# Patient Record
Sex: Female | Born: 1945 | Hispanic: Refuse to answer | Smoking: Never smoker
Health system: Southern US, Community
[De-identification: ages and names within clinical notes are randomized; demographics above are authoritative.]

## PROBLEM LIST (undated history)

## (undated) DIAGNOSIS — D649 Anemia, unspecified: Secondary | ICD-10-CM

## (undated) DIAGNOSIS — C189 Malignant neoplasm of colon, unspecified: Secondary | ICD-10-CM

## (undated) HISTORY — PX: APPENDECTOMY: SHX54

## (undated) HISTORY — PX: KNEE ARTHROSCOPY: SHX127

## (undated) HISTORY — PX: COLON RESECTION: SHX5231

## (undated) HISTORY — PX: COLOSTOMY: SHX63

---

## 2011-08-18 DIAGNOSIS — D649 Anemia, unspecified: Secondary | ICD-10-CM

## 2011-08-18 DIAGNOSIS — C189 Malignant neoplasm of colon, unspecified: Secondary | ICD-10-CM

## 2011-08-18 HISTORY — DX: Anemia, unspecified: D64.9

## 2011-08-18 HISTORY — DX: Malignant neoplasm of colon, unspecified: C18.9

## 2011-11-12 ENCOUNTER — Telehealth: Payer: Self-pay | Admitting: Oncology

## 2011-11-12 NOTE — Telephone Encounter (Signed)
S/w the pt and she is aware of her appt with dr Truett Perna on 11/22/2011

## 2011-11-15 ENCOUNTER — Telehealth: Payer: Self-pay | Admitting: Oncology

## 2011-11-15 NOTE — Telephone Encounter (Signed)
Dx-Appendical. NP packet mailed out.

## 2011-11-16 ENCOUNTER — Telehealth: Payer: Self-pay | Admitting: Oncology

## 2011-11-16 NOTE — Telephone Encounter (Signed)
Pt. Will hand carry her medical records to her appt. With Dr. Truett Perna on 11/22/11.  No chart.

## 2011-11-22 ENCOUNTER — Ambulatory Visit (HOSPITAL_BASED_OUTPATIENT_CLINIC_OR_DEPARTMENT_OTHER): Payer: Medicare Other | Admitting: Oncology

## 2011-11-22 ENCOUNTER — Ambulatory Visit (HOSPITAL_BASED_OUTPATIENT_CLINIC_OR_DEPARTMENT_OTHER): Payer: Medicare Other

## 2011-11-22 VITALS — BP 143/77 | HR 78 | Temp 97.4°F | Resp 20 | Ht 63.0 in | Wt 198.9 lb

## 2011-11-22 DIAGNOSIS — C189 Malignant neoplasm of colon, unspecified: Secondary | ICD-10-CM

## 2011-11-22 DIAGNOSIS — C779 Secondary and unspecified malignant neoplasm of lymph node, unspecified: Secondary | ICD-10-CM

## 2011-11-22 NOTE — Progress Notes (Signed)
Still packing abdominal wound daily.

## 2011-11-22 NOTE — Progress Notes (Signed)
[facility] New Patient Consult   Referring MD: Kyra Searles   Kristen Alexander 66 y.o.  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 A CT of the abdomen and pelvis on 09/17/2011 found the liver, gallbladder, spleen, adrenal glands, and pancreas to be unremarkable. A small amount of free fluid was noted around the liver. A moderate amount of free fluid was noted in the pelvis. The uterus and adnexal structures appeared unremarkable. The appendix was noted to be thickened with. Appendiceal stranding. Numerous dilated small bowel loops were noted with numerous air-fluid levels.  She was taken to the operating room on 09/17/2011 with a presumed diagnosis of appendicitis. A planned laparoscopic appendectomy was converted to an open exploratory laparotomy and extended right colectomy/small bowel resection. Metastatic implants were noted along the lateral peritoneal wall. The terminal ileum was noted to be involved with "white scarring neoplasm "and high-grade stenosis. A large indurated mass was noted at the ileocecal region. A right colectomy was performed with a side-to-side anastomosis between the proximal right transverse colon and ileum. There was no evidence of seeding or peritoneal lesions of the ovary structures were surface of the liver.  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 Tumor extended through all layers of the muscularis propria into the. Peri-intestinal soft tissue and involved the serosal surface. Extensive lymphatic space invasion was noted with extensive involvement of the pericolonic soft tissue noted. Metastatic adenocarcinoma was noted within the ileum and 13 of 16 peri-intestinal lymph  nodes. The appendix was not identified.  She has recovered from surgery. She has been evaluated by Dr. Christoper Fabian from medical oncology. He has recommended an observation approach.  Past medical history: G0 P0  Past surgical history: Left knee arthroscopy in 2  Family history: Her brother died of "cancer "-he was a smoker. She is not aware of the specific tumor type. She had 2 brothers and 2 sisters. No other family history of cancer including colon cancer.  Medications: None  Allergies: No Known Allergies  Social History: She lives in [place]. She works in a Materials engineer. She does not use tobacco. She drinks alcohol on rare occasion. No risk factor for HIV or hepatitis.  ROS:   Positives include: Cramping abdominal pain for 1 week prior to surgery  A complete ROS was otherwise negative.  Physical Exam:  Blood pressure 143/77, pulse 78, temperature 97.4 F (36.3 C), temperature source Oral, resp. rate 20, height 5\' 3"  (1.6 m), weight 198 lb 14.4 oz (90.22 kg).  HEENT: Oropharynx without visible mass, neck without mass Lungs: Clear bilaterally Cardiac: Regular rate and rhythm Abdomen: No hepatomegaly, no mass, no apparent ascites, no splenomegaly  Vascular: No leg edema Lymph nodes: No cervical, supraclavicular, axillary or inguinal nodes Neurologic: Alert and oriented, the motor exam appears intact in the upper and lower extremities Skin: No rash   LAB:    CEA- 0.6 on 10/11/2011 CEA-1.20 on 11/15/2011  Hemoglobin 10.6, MCV 74.6, ferritin 11 on 10/11/2011    Radiology: As per history of present illness, chest x-ray-portable on 09/21/2011-normal PET scan on 10/15/2011-negative    Assessment/Plan:   1. 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  2. Microcytic anemia -- likely iron deficiency   Disposition:   She has been  diagnosed with metastatic adenocarcinoma. The tumor appears to have started at the cecum/appendix with metastatic disease involving peritoneal implants and the small bowel. She underwent an extended right hemicolectomy/small bowel resection.  [name] appears asymptomatic from the metastatic colorectal cancer. There is currently no "measurable "disease on physical exam and a staging PET scan. The CEA is normal.  I discussed treatment options at length with the patient and her sister. She understands no therapy will be curative. In the absence of measurable disease I recommend an observation approach. My recommendation is to continue following the CEA and obtain a restaging CT evaluation at a 3-67month interval.  She appears to be a candidate for systemic chemotherapy if she develops significant disease progression on restaging CTs, or symptoms related to the metastatic tumor burden.  We discussed a referral to Dr. Lenis Noon in surgical oncology at [facility] to consider debulking surgery/intraperitoneal chemotherapy. I explained there is no proven survival benefit to this approach, but this could be considered with the lack of visceral tumor involvement. She declined this referral.  She is comfortable with an observation approach. [name] plans to continue clinical followup with Dr. Christoper Fabian.  She lives at [facility] for the majority of the time. We did not schedule a followup appointment at the [facility]. We will be glad to see her in the future as needed.  Caryl Manas 11/22/2011, 8:22 PM

## 2011-11-23 ENCOUNTER — Ambulatory Visit: Payer: Self-pay | Admitting: Oncology

## 2011-11-30 ENCOUNTER — Telehealth: Payer: Self-pay | Admitting: *Deleted

## 2011-11-30 NOTE — Telephone Encounter (Signed)
Received phone call from patient stating she has thought about it and would like a referral to Dr. Lenis Noon at [facility].  This request has been given to Dr. Truett Perna.

## 2012-01-05 ENCOUNTER — Telehealth: Payer: Self-pay | Admitting: *Deleted

## 2012-01-05 NOTE — Telephone Encounter (Signed)
Spoke with patient by phone to follow-up after her visit with Dr. Lenis Noon in [facility].  Patient has decided to have chemotherapy in [place] (where she resides most of the time).  She stated she will be having surgery under the care of Dr. Lenis Noon after treatment.  This RN wished her well and reminded her to please call if she should ever need care at [facility].  She was very Adult nurse.

## 2012-01-18 HISTORY — PX: PORTACATH PLACEMENT: SHX2246

## 2012-06-05 ENCOUNTER — Telehealth: Payer: Self-pay | Admitting: *Deleted

## 2012-06-05 NOTE — Telephone Encounter (Signed)
Received phone call from patient stating she was referred back to Dr. Truett Perna from Dr. Lenis Noon.  She was inquiring about an appointment.  This RN will check on referral information and will get back in touch with patient re: an appointment.

## 2012-06-05 NOTE — Telephone Encounter (Signed)
Spoke with patient by phone and confirmed appointment with Dr. Truett Perna for 06/16/12.

## 2012-06-06 ENCOUNTER — Telehealth: Payer: Self-pay | Admitting: Oncology

## 2012-06-06 ENCOUNTER — Ambulatory Visit (HOSPITAL_BASED_OUTPATIENT_CLINIC_OR_DEPARTMENT_OTHER): Payer: Medicare Other | Admitting: Oncology

## 2012-06-06 ENCOUNTER — Ambulatory Visit (HOSPITAL_BASED_OUTPATIENT_CLINIC_OR_DEPARTMENT_OTHER): Payer: Medicare Other

## 2012-06-06 ENCOUNTER — Encounter: Payer: Self-pay | Admitting: Oncology

## 2012-06-06 ENCOUNTER — Other Ambulatory Visit: Payer: Self-pay | Admitting: *Deleted

## 2012-06-06 VITALS — BP 127/76 | HR 96 | Temp 98.2°F | Resp 20 | Wt 159.0 lb

## 2012-06-06 DIAGNOSIS — C779 Secondary and unspecified malignant neoplasm of lymph node, unspecified: Secondary | ICD-10-CM

## 2012-06-06 DIAGNOSIS — C189 Malignant neoplasm of colon, unspecified: Secondary | ICD-10-CM

## 2012-06-06 MED ORDER — OXYCODONE HCL 10 MG PO TABS: 10.0000 mg | ORAL_TABLET | ORAL | Status: DC | PRN

## 2012-06-06 NOTE — Telephone Encounter (Signed)
Pt r/s to 02/18 @ 3:30 per MD.

## 2012-06-06 NOTE — Telephone Encounter (Signed)
Talked to patient , gave her appt for lab, chemo class, MD and Ml visit advised patient to get appt calendar for this month of MArch 2014

## 2012-06-06 NOTE — Progress Notes (Signed)
[facility]    OFFICE PROGRESS NOTE   INTERVAL HISTORY:   I saw her in August of 2013 after she had been diagnosed with metastatic adenocarcinoma of the cecum in May of 2013.  She saw Dr. Lenis Noon to consider debulking surgery and intraperitoneal chemotherapy. He recommended FOLFOX chemotherapy. She returned to Dr. Christoper Fabian and reports receiving 3 cycles of FOLFOX. She states that she developed progressive nausea while on FOLFOX with associated weight loss. The chemotherapy was switched to FOLFIRI (she reports completing 2 cycles) and she was admitted to [facility] in December of 2013. She underwent a laparotomy with a jejuno-colic anastomosis. The distal jejunum and most of the ileum was obstructed by tumor.  The nausea resolved following the bypass surgery in December of 2013. She now has a good appetite. Intermittent abdominal "cramping "persists.  She saw Dr. Lenis Noon on 05/31/2012. He recommends additional FOLFIRI chemotherapy +/-Avastin prior to consideration of debulking surgery.  A restaging CT at [facility] on 05/31/2012 revealed an area of focally dilated small bowel in the pelvis with areas of small bowel wall thickening and hypoenhancement. A nodule was noted in the small bowel mesentery measuring 2.2 x 1.5 cm. The lower chest was unremarkable.  Ms. Cliett is now living in [place]. She is referred to continue chemotherapy here.  Review of systems: Positives-intermittent cramping abdominal pain, 40 pound weight loss prior to abdominal bypass surgery in December of 2013 A complete review of systems was otherwise negative  Objective:  Vital signs in last 24 hours:  Blood pressure 127/76, pulse 96, temperature 98.2 F (36.8 C), temperature source Oral, resp. rate 20, weight 159 lb (72.122 kg).    HEENT: No thrush or ulcers Lymphatics: No cervical, supraclavicular, axillary, or inguinal nodes Resp: Lungs clear bilaterally Cardio: Regular rate  and rhythm GI: No hepatomegaly, no apparent ascites, no mass, nontender, healed midline incision Vascular: No leg edema Neuro: Alert and oriented  Skin: No rash   Left arm passport without erythema   Medications: I have reviewed the patient's current medications.  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 -Status post FOLFOX (3 cycles) with progressive nausea and weight loss, chemotherapy switch to FOLFIRI (2 cycles) -Admission with a bowel obstruction December 2013, status post jejuno-colic anastomosis with resolution of obstructive symptoms, progressive carcinomatosis noted at the time of surgery  2. history of a Microcytic anemia -- likely iron deficiency   3. Weight loss secondary to progressive metastatic colon cancer with a bowel obstruction  4. Left arm passport  Disposition:  Ms. Deller has metastatic colon cancer with abdominal carcinomatosis. She developed progressive obstructive symptoms/weight loss while being treated with FOLFOX chemotherapy in the fall of 2013. The chemotherapy was switched to FOLFIRI as she was admitted with a bowel obstruction after 2 cycles (we have requested chemotherapy records and the most recent operative note).  She underwent a palliative bypass procedure and now feels well. Dr. Lenis Noon has reevaluated for HIPEC. He recommends 4-6 cycles of FOLFIRI prior to a restaging evaluation at [facility].  It is not clear that she has progressed on FOLFIRI, but the obstructive symptoms were not relieved with 2 cycles of FOLFIRI. I recommend adding Avastin. We reviewed the potential toxicities associated with FOLFIRI including the chance for nausea/vomiting, mucositis, abdominal pain, and diarrhea. We discussed the allergic reaction, hypertension,  thromboembolic  disease, bleeding, and bowel perforation associated with Avastin. She agrees to proceed with FOLFIRI/Avastin. Ms. Trouten will attend a chemotherapy teaching class.  A first cycle of FOLFIRI/Avastin is scheduled for 06/14/2012. She will return for an office visit and the next cycle of chemotherapy on 06/29/2012. We will obtain a baseline CEA when she returns for the chemotherapy teaching class.  Approximately 45 minutes were spent with the patient today. The majority of the time was spent in counseling/coordination of care.   Thornton Papas, MD  06/06/2012  5:17 PM

## 2012-06-06 NOTE — Progress Notes (Signed)
Has PASSPORT  In left arm for venous access.

## 2012-06-08 ENCOUNTER — Other Ambulatory Visit: Payer: Medicare Other

## 2012-06-08 ENCOUNTER — Other Ambulatory Visit: Payer: Medicare Other | Admitting: Lab

## 2012-06-08 ENCOUNTER — Other Ambulatory Visit: Payer: Self-pay | Admitting: *Deleted

## 2012-06-08 ENCOUNTER — Telehealth: Payer: Self-pay | Admitting: *Deleted

## 2012-06-08 ENCOUNTER — Telehealth: Payer: Self-pay | Admitting: Oncology

## 2012-06-08 LAB — CBC WITH DIFFERENTIAL/PLATELET
Basophils Absolute: 0 10*3/uL (ref 0.0–0.1)
Eosinophils Absolute: 0.2 10*3/uL (ref 0.0–0.5)
HCT: 33.2 % — ABNORMAL LOW (ref 34.8–46.6)
LYMPH%: 29.7 % (ref 14.0–49.7)
MCV: 86.8 fL (ref 79.5–101.0)
MONO#: 0.5 10*3/uL (ref 0.1–0.9)
MONO%: 7.8 % (ref 0.0–14.0)
NEUT#: 3.7 10*3/uL (ref 1.5–6.5)
NEUT%: 59.2 % (ref 38.4–76.8)
Platelets: 363 10*3/uL (ref 145–400)
WBC: 6.3 10*3/uL (ref 3.9–10.3)

## 2012-06-08 LAB — COMPREHENSIVE METABOLIC PANEL (CC13)
ALT: 16 U/L (ref 0–55)
BUN: 14.9 mg/dL (ref 7.0–26.0)
CO2: 25 mEq/L (ref 22–29)
Calcium: 9.1 mg/dL (ref 8.4–10.4)
Chloride: 105 mEq/L (ref 98–107)
Creatinine: 0.7 mg/dL (ref 0.6–1.1)
Glucose: 89 mg/dl (ref 70–99)

## 2012-06-08 NOTE — Telephone Encounter (Signed)
talked to patient and gave her appt for lab ,MD and chemo for February and March 2014

## 2012-06-08 NOTE — Telephone Encounter (Signed)
Review of all records from [place] and [facility]-no imaging report to document cath tip placement of her PASPORT. Ordered CXR per Dr. Truett Perna to be done prior to her chemo. Attempted to reach patient at both #'s without success.

## 2012-06-08 NOTE — Telephone Encounter (Signed)
Per staff message and POF I have scheduled appts.  JMW  

## 2012-06-09 ENCOUNTER — Telehealth: Payer: Self-pay | Admitting: *Deleted

## 2012-06-09 NOTE — Telephone Encounter (Signed)
Called pt, instructed her to go to [facility] radiology 2/25 for chest XRay. Need to verify tip placement prior to chemo. She voiced understanding.

## 2012-06-13 ENCOUNTER — Ambulatory Visit (HOSPITAL_COMMUNITY)
Admission: RE | Admit: 2012-06-13 | Discharge: 2012-06-13 | Disposition: A | Discharge status: Home / Self Care | Payer: Medicare Other | Source: Ambulatory Visit | Attending: Oncology | Admitting: Oncology

## 2012-06-13 DIAGNOSIS — Z452 Encounter for adjustment and management of vascular access device: Secondary | ICD-10-CM | POA: Insufficient documentation

## 2012-06-13 DIAGNOSIS — C189 Malignant neoplasm of colon, unspecified: Secondary | ICD-10-CM | POA: Insufficient documentation

## 2012-06-14 ENCOUNTER — Ambulatory Visit (HOSPITAL_BASED_OUTPATIENT_CLINIC_OR_DEPARTMENT_OTHER): Payer: Medicare Other

## 2012-06-14 ENCOUNTER — Other Ambulatory Visit: Payer: Self-pay | Admitting: Oncology

## 2012-06-14 VITALS — BP 122/77 | HR 63 | Temp 98.0°F | Resp 20

## 2012-06-14 DIAGNOSIS — C189 Malignant neoplasm of colon, unspecified: Secondary | ICD-10-CM

## 2012-06-14 MED ORDER — LEUCOVORIN CALCIUM INJECTION 350 MG
400.0000 mg/m2 | Freq: Once | INTRAVENOUS | Status: AC
  Administered 2012-06-14: 716 mg via INTRAVENOUS
  Filled 2012-06-14: qty 35.8

## 2012-06-14 MED ORDER — INFLUENZA VIRUS VACC SPLIT PF IM SUSP
0.5000 mL | Freq: Once | INTRAMUSCULAR | Status: AC
  Administered 2012-06-14: 0.5 mL via INTRAMUSCULAR
  Filled 2012-06-14: qty 0.5

## 2012-06-14 MED ORDER — ONDANSETRON 16 MG/50ML IVPB (CHCC)
16.0000 mg | Freq: Once | INTRAVENOUS | Status: AC
  Administered 2012-06-14: 16 mg via INTRAVENOUS

## 2012-06-14 MED ORDER — FLUOROURACIL CHEMO INJECTION 2.5 GM/50ML
400.0000 mg/m2 | Freq: Once | INTRAVENOUS | Status: AC
  Administered 2012-06-14: 700 mg via INTRAVENOUS
  Filled 2012-06-14: qty 14

## 2012-06-14 MED ORDER — SODIUM CHLORIDE 0.9 % IV SOLN
Freq: Once | INTRAVENOUS | Status: AC
  Administered 2012-06-14: 10:00:00 via INTRAVENOUS

## 2012-06-14 MED ORDER — IRINOTECAN HCL CHEMO INJECTION 100 MG/5ML
180.0000 mg/m2 | Freq: Once | INTRAVENOUS | Status: AC
  Administered 2012-06-14: 322 mg via INTRAVENOUS
  Filled 2012-06-14: qty 16.1

## 2012-06-14 MED ORDER — SODIUM CHLORIDE 0.9 % IV SOLN: Freq: Once | INTRAVENOUS | Status: DC

## 2012-06-14 MED ORDER — SODIUM CHLORIDE 0.9 % IV SOLN
2400.0000 mg/m2 | INTRAVENOUS | Status: AC
  Administered 2012-06-14: 4300 mg via INTRAVENOUS
  Filled 2012-06-14: qty 86

## 2012-06-14 MED ORDER — HEPARIN SOD (PORK) LOCK FLUSH 100 UNIT/ML IV SOLN
500.0000 [IU] | Freq: Once | INTRAVENOUS | Status: AC | PRN
  Filled 2012-06-14: qty 5

## 2012-06-14 MED ORDER — SODIUM CHLORIDE 0.9 % IJ SOLN
10.0000 mL | INTRAMUSCULAR | Status: DC | PRN
Start: 2012-06-14 — End: 2015-10-03
  Filled 2012-06-14: qty 10

## 2012-06-14 MED ORDER — DEXAMETHASONE SODIUM PHOSPHATE 4 MG/ML IJ SOLN
20.0000 mg | Freq: Once | INTRAMUSCULAR | Status: AC
  Administered 2012-06-14: 20 mg via INTRAVENOUS

## 2012-06-14 MED ORDER — SODIUM CHLORIDE 0.9 % IV SOLN
5.0000 mg/kg | Freq: Once | INTRAVENOUS | Status: AC
  Administered 2012-06-14: 350 mg via INTRAVENOUS
  Filled 2012-06-14: qty 14

## 2012-06-14 MED ORDER — SODIUM CHLORIDE 0.9 % IJ SOLN
10.0000 mL | INTRAMUSCULAR | Status: DC | PRN
  Filled 2012-06-14: qty 10

## 2012-06-14 NOTE — Patient Instructions (Addendum)
[facility] Discharge Instructions for Patients Receiving Chemotherapy  Today you received the following chemotherapy agents: avastin, irinotecan, leucovorin, 11fu, 45fu pump.  To help prevent nausea and vomiting after your treatment, we encourage you to take your nausea medication.  Take it as often as prescribed.    If you develop nausea and vomiting that is not controlled by your nausea medication, call the clinic. If it is after clinic hours your family physician or the after hours number for the clinic or go to the Emergency Department.   BELOW ARE SYMPTOMS THAT SHOULD BE REPORTED IMMEDIATELY:  *FEVER GREATER THAN 100.5 F  *CHILLS WITH OR WITHOUT FEVER  NAUSEA AND VOMITING THAT IS NOT CONTROLLED WITH YOUR NAUSEA MEDICATION  *UNUSUAL SHORTNESS OF BREATH  *UNUSUAL BRUISING OR BLEEDING  TENDERNESS IN MOUTH AND THROAT WITH OR WITHOUT PRESENCE OF ULCERS  *URINARY PROBLEMS  *BOWEL PROBLEMS  UNUSUAL RASH Items with * indicate a potential emergency and should be followed up as soon as possible.  One of the nurses will contact you 24 hours after your treatment. Please let the nurse know about any problems that you may have experienced.  Feel free to call the clinic you have any questions or concerns. The clinic phone number is [phone].   I have been informed and understand all the instructions given to me. I know to contact the clinic, my physician, or go to the Emergency Department if any problems should occur. I do not have any questions at this time, but understand that I may call the clinic during office hours   should I have any questions or need assistance in obtaining follow up care.    __________________________________________  _____________  __________ Signature of Patient or Authorized Representative            Date                   Time    __________________________________________ Nurse's Signature    Bevacizumab injection (Avastin) What  is this medicine? BEVACIZUMAB (be va SIZ yoo mab) is a chemotherapy drug. It targets a protein found in many cancer cell types, and halts cancer growth. This drug treats many cancers including non-small cell lung cancer, and colon or rectal cancer. It is usually given with other chemotherapy drugs. This medicine may be used for other purposes; ask your health care provider or pharmacist if you have questions. What should I tell my health care provider before I take this medicine? They need to know if you have any of these conditions: -blood clots -heart disease, including heart failure, heart attack, or chest pain (angina) -high blood pressure -infection (especially a virus infection such as chickenpox, cold sores, or herpes) -kidney disease -lung disease -prior chemotherapy with doxorubicin, daunorubicin, epirubicin, or other anthracycline type chemotherapy agents -recent or ongoing radiation therapy -recent surgery -stroke -an unusual or allergic reaction to bevacizumab, hamster proteins, mouse proteins, other medicines, foods, dyes, or preservatives -pregnant or trying to get pregnant -breast-feeding How should I use this medicine? This medicine is for infusion into a vein. It is given by a health care professional in a hospital or clinic setting. Talk to your pediatrician regarding the use of this medicine in children. Special care may be needed. Overdosage: If you think you have taken too much of this medicine contact a poison control center or emergency room at once. NOTE: This medicine is only for you. Do not share this medicine with others. What if I miss  a dose? It is important not to miss your dose. Call your doctor or health care professional if you are unable to keep an appointment. What may interact with this medicine? Interactions are not expected. This list may not describe all possible interactions. Give your health care provider a list of all the medicines, herbs,  non-prescription drugs, or dietary supplements you use. Also tell them if you smoke, drink alcohol, or use illegal drugs. Some items may interact with your medicine. What should I watch for while using this medicine? Your condition will be monitored carefully while you are receiving this medicine. You will need important blood work and urine testing done while you are taking this medicine. During your treatment, let your health care professional know if you have any unusual symptoms, such as difficulty breathing. This medicine may rarely cause 'gastrointestinal perforation' (holes in the stomach, intestines or colon), a serious side effect requiring surgery to repair. This medicine should be started at least 28 days following major surgery and the site of the surgery should be totally healed. Check with your doctor before scheduling dental work or surgery while you are receiving this treatment. Talk to your doctor if you have recently had surgery or if you have a wound that has not healed. Do not become pregnant while taking this medicine. Women should inform their doctor if they wish to become pregnant or think they might be pregnant. There is a potential for serious side effects to an unborn child. Talk to your health care professional or pharmacist for more information. Do not breast-feed an infant while taking this medicine. This medicine has caused ovarian failure in some women. This medicine may interfere with the ability to have a child. You should talk to your doctor or health care professional if you are concerned about your fertility. What side effects may I notice from receiving this medicine? Side effects that you should report to your doctor or health care professional as soon as possible: -allergic reactions like skin rash, itching or hives, swelling of the face, lips, or tongue -signs of infection - fever or chills, cough, sore throat, pain or trouble passing urine -signs of decreased  platelets or bleeding - bruising, pinpoint red spots on the skin, black, tarry stools, nosebleeds, blood in the urine -breathing problems -changes in vision -chest pain -confusion -jaw pain, especially after dental work -mouth sores -seizures -severe abdominal pain -severe headache -sudden numbness or weakness of the face, arm or leg -swelling of legs or ankles -symptoms of a stroke: change in mental awareness, inability to talk or move one side of the body (especially in patients with lung cancer) -trouble passing urine or change in the amount of urine -trouble speaking or understanding -trouble walking, dizziness, loss of balance or coordination Side effects that usually do not require medical attention (report to your doctor or health care professional if they continue or are bothersome): -constipation -diarrhea -dry skin -headache -loss of appetite -nausea, vomiting This list may not describe all possible side effects. Call your doctor for medical advice about side effects. You may report side effects to FDA at 1-800-FDA-1088. Where should I keep my medicine? This drug is given in a hospital or clinic and will not be stored at home. NOTE: This sheet is a summary. It may not cover all possible information. If you have questions about this medicine, talk to your doctor, pharmacist, or health care provider.  2012, Elsevier/Gold Standard. (03/06/2010 4:25:37 PM)

## 2012-06-15 ENCOUNTER — Telehealth: Payer: Self-pay | Admitting: *Deleted

## 2012-06-15 NOTE — Telephone Encounter (Signed)
Spoke with "Alvino Chapel" who says she is doing well.  Denies any side effects or ill symptoms as a result of yesterday's treatment.  Reports her pump beeped at 3:30 am.  Called the 1-800 number was talked through some things for the "No Flow" message and pump resumed.  No further issues and denies questions.

## 2012-06-15 NOTE — Telephone Encounter (Signed)
Message copied by Augusto Garbe on Thu Jun 15, 2012 12:09 PM ------      Message from: Adella Hare K      Created: Wed Jun 14, 2012 10:13 AM      Regarding: 1st chemo      Contact: [phone]       1st time avastin.  New Folfiri patient - has had folfiri treatment on different hospital.   ------

## 2012-06-16 ENCOUNTER — Ambulatory Visit (HOSPITAL_BASED_OUTPATIENT_CLINIC_OR_DEPARTMENT_OTHER): Payer: Medicare Other

## 2012-06-16 ENCOUNTER — Ambulatory Visit: Payer: Medicare Other

## 2012-06-16 ENCOUNTER — Ambulatory Visit: Payer: Medicare Other | Admitting: Oncology

## 2012-06-16 ENCOUNTER — Other Ambulatory Visit: Payer: Medicare Other | Admitting: Lab

## 2012-06-16 VITALS — BP 121/61 | HR 80 | Temp 98.7°F

## 2012-06-16 DIAGNOSIS — C50919 Malignant neoplasm of unspecified site of unspecified female breast: Secondary | ICD-10-CM

## 2012-06-16 DIAGNOSIS — C189 Malignant neoplasm of colon, unspecified: Secondary | ICD-10-CM

## 2012-06-16 MED ORDER — SODIUM CHLORIDE 0.9 % IJ SOLN
10.0000 mL | INTRAMUSCULAR | Status: DC | PRN
  Administered 2012-06-16: 10 mL
  Filled 2012-06-16: qty 10

## 2012-06-16 MED ORDER — HEPARIN SOD (PORK) LOCK FLUSH 100 UNIT/ML IV SOLN
500.0000 [IU] | Freq: Once | INTRAVENOUS | Status: AC | PRN
  Administered 2012-06-16: 500 [IU]
  Filled 2012-06-16: qty 5

## 2012-06-16 NOTE — Patient Instructions (Signed)
Call MD for problems or concerns 

## 2012-06-20 ENCOUNTER — Other Ambulatory Visit: Payer: Self-pay | Admitting: *Deleted

## 2012-06-20 MED ORDER — ONDANSETRON 8 MG PO TBDP: 8.0000 mg | ORAL_TABLET | Freq: Three times a day (TID) | ORAL | Status: DC | PRN

## 2012-06-26 ENCOUNTER — Other Ambulatory Visit: Payer: Self-pay | Admitting: Oncology

## 2012-06-29 ENCOUNTER — Other Ambulatory Visit (HOSPITAL_BASED_OUTPATIENT_CLINIC_OR_DEPARTMENT_OTHER): Payer: Medicare Other | Admitting: Lab

## 2012-06-29 ENCOUNTER — Ambulatory Visit (HOSPITAL_BASED_OUTPATIENT_CLINIC_OR_DEPARTMENT_OTHER): Payer: Medicare Other

## 2012-06-29 ENCOUNTER — Ambulatory Visit (HOSPITAL_BASED_OUTPATIENT_CLINIC_OR_DEPARTMENT_OTHER): Payer: Medicare Other | Admitting: Oncology

## 2012-06-29 VITALS — BP 130/71 | HR 83 | Temp 98.2°F | Resp 20 | Ht 63.0 in | Wt 152.0 lb

## 2012-06-29 DIAGNOSIS — C189 Malignant neoplasm of colon, unspecified: Secondary | ICD-10-CM

## 2012-06-29 DIAGNOSIS — R634 Abnormal weight loss: Secondary | ICD-10-CM

## 2012-06-29 DIAGNOSIS — C50919 Malignant neoplasm of unspecified site of unspecified female breast: Secondary | ICD-10-CM

## 2012-06-29 DIAGNOSIS — R110 Nausea: Secondary | ICD-10-CM

## 2012-06-29 LAB — CBC WITH DIFFERENTIAL/PLATELET
BASO%: 0.7 % (ref 0.0–2.0)
LYMPH%: 36.8 % (ref 14.0–49.7)
MCHC: 31.6 g/dL (ref 31.5–36.0)
MONO#: 0.7 10*3/uL (ref 0.1–0.9)
NEUT#: 1.7 10*3/uL (ref 1.5–6.5)
RBC: 3.94 10*6/uL (ref 3.70–5.45)
RDW: 14.4 % (ref 11.2–14.5)
WBC: 4.3 10*3/uL (ref 3.9–10.3)
lymph#: 1.6 10*3/uL (ref 0.9–3.3)
nRBC: 0 % (ref 0–0)

## 2012-06-29 LAB — COMPREHENSIVE METABOLIC PANEL (CC13)
ALT: 14 U/L (ref 0–55)
AST: 13 U/L (ref 5–34)
Calcium: 8.8 mg/dL (ref 8.4–10.4)
Chloride: 108 mEq/L — ABNORMAL HIGH (ref 98–107)
Creatinine: 0.6 mg/dL (ref 0.6–1.1)
Sodium: 141 mEq/L (ref 136–145)
Total Protein: 5.8 g/dL — ABNORMAL LOW (ref 6.4–8.3)

## 2012-06-29 MED ORDER — FLUOROURACIL CHEMO INJECTION 2.5 GM/50ML
400.0000 mg/m2 | Freq: Once | INTRAVENOUS | Status: AC
  Administered 2012-06-29: 700 mg via INTRAVENOUS
  Filled 2012-06-29: qty 14

## 2012-06-29 MED ORDER — SODIUM CHLORIDE 0.9 % IV SOLN
2400.0000 mg/m2 | INTRAVENOUS | Status: DC
  Administered 2012-06-29: 4300 mg via INTRAVENOUS
  Filled 2012-06-29: qty 86

## 2012-06-29 MED ORDER — DEXAMETHASONE SODIUM PHOSPHATE 4 MG/ML IJ SOLN
20.0000 mg | Freq: Once | INTRAMUSCULAR | Status: AC
  Administered 2012-06-29: 20 mg via INTRAVENOUS

## 2012-06-29 MED ORDER — OXYCODONE HCL 10 MG PO TABS: 10.0000 mg | ORAL_TABLET | ORAL | Status: DC | PRN

## 2012-06-29 MED ORDER — SODIUM CHLORIDE 0.9 % IV SOLN
5.0000 mg/kg | Freq: Once | INTRAVENOUS | Status: AC
  Administered 2012-06-29: 350 mg via INTRAVENOUS
  Filled 2012-06-29: qty 14

## 2012-06-29 MED ORDER — IRINOTECAN HCL CHEMO INJECTION 100 MG/5ML
180.0000 mg/m2 | Freq: Once | INTRAVENOUS | Status: AC
  Administered 2012-06-29: 322 mg via INTRAVENOUS
  Filled 2012-06-29: qty 16.1

## 2012-06-29 MED ORDER — POTASSIUM CHLORIDE CRYS ER 20 MEQ PO TBCR: 20.0000 meq | EXTENDED_RELEASE_TABLET | Freq: Every day | ORAL | Status: DC

## 2012-06-29 MED ORDER — ATROPINE SULFATE 1 MG/ML IJ SOLN
0.5000 mg | Freq: Once | INTRAMUSCULAR | Status: AC | PRN
  Administered 2012-06-29: 0.5 mg via INTRAVENOUS

## 2012-06-29 MED ORDER — SODIUM CHLORIDE 0.9 % IV SOLN
Freq: Once | INTRAVENOUS | Status: AC
  Administered 2012-06-29: 11:00:00 via INTRAVENOUS

## 2012-06-29 MED ORDER — ONDANSETRON 16 MG/50ML IVPB (CHCC)
16.0000 mg | Freq: Once | INTRAVENOUS | Status: AC
  Administered 2012-06-29: 16 mg via INTRAVENOUS

## 2012-06-29 MED ORDER — DEXTROSE 5 % IV SOLN
400.0000 mg/m2 | Freq: Once | INTRAVENOUS | Status: AC
  Administered 2012-06-29: 716 mg via INTRAVENOUS
  Filled 2012-06-29: qty 35.8

## 2012-06-29 NOTE — Progress Notes (Signed)
   [facility]    OFFICE PROGRESS NOTE   INTERVAL HISTORY:   She returns as scheduled. She completed a first cycle of FOLFIRI/Avastin on 06/14/2012. No nausea, mouth sores, or diarrhea following chemotherapy. No bleeding. She continues to have intermittent abdominal pain.  Objective:  Vital signs in last 24 hours:  Blood pressure 130/71, pulse 83, temperature 98.2 F (36.8 C), temperature source Oral, resp. rate 20, height 5\' 3"  (1.6 m), weight 152 lb (68.947 kg).    HEENT: no thrush or ulcers Resp: lungs clear bilaterally Cardio: regular rate and rhythm GI: no hepatomegaly, nontender Vascular: no leg edema   Portacath/PICC-without erythema  Lab Results:  Lab Results  Component Value Date   WBC 4.3 06/29/2012   HGB 10.6* 06/29/2012   HCT 33.5* 06/29/2012   MCV 85.0 06/29/2012   PLT 452* 06/29/2012  ANC 1.7    Medications: I have reviewed the patient's current medications.  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  -Status post FOLFOX (3 cycles) with progressive nausea and weight loss, chemotherapy switch to FOLFIRI (2 cycles)  -Admission with a bowel obstruction December 2013, status post jejuno-colic anastomosis with resolution of obstructive symptoms, progressive carcinomatosis noted at the time of surgery  -FOLFIRI/Avastin cycle 1 on 06/14/2012 2. history of a Microcytic anemia -- likely iron deficiency  3. Weight loss secondary to progressive metastatic colon cancer with a bowel obstruction  4. Left arm passport    Disposition:  She tolerated the first cycle of FOLFIRI/Avastin well. The plan is to proceed with cycle 2 today. She will return for an office visit and chemotherapy in 2 weeks.the plan is to continue FOLFIRI/Avastin until she undergoes a  restaging CT evaluation at [facility]   Thornton Papas, MD  06/29/2012  9:54 PM

## 2012-06-29 NOTE — Progress Notes (Signed)
Called patient after she left clinic with K+ result of 3.0 and instructed her to begin Ktab 20 meq bid today, then one daily. Will recheck in 2 weeks. Orders per Dr. Truett Perna.

## 2012-06-29 NOTE — Progress Notes (Signed)
1025 Urine protein resulted at trace amount.

## 2012-06-29 NOTE — Patient Instructions (Addendum)
[facility] Discharge Instructions for Patients Receiving Chemotherapy  Today you received the following chemotherapy agents Avastin, Irinotecan, Leucovorin and Adrucil.   To help prevent nausea and vomiting after your treatment, we encourage you to take your nausea medication. Begin taking your nausea medication as often as prescribed for by Dr. Truett Perna.    If you develop nausea and vomiting that is not controlled by your nausea medication, call the clinic. If it is after clinic hours your family physician or the after hours number for the clinic or go to the Emergency Department.   BELOW ARE SYMPTOMS THAT SHOULD BE REPORTED IMMEDIATELY:  *FEVER GREATER THAN 100.5 F  *CHILLS WITH OR WITHOUT FEVER  NAUSEA AND VOMITING THAT IS NOT CONTROLLED WITH YOUR NAUSEA MEDICATION  *UNUSUAL SHORTNESS OF BREATH  *UNUSUAL BRUISING OR BLEEDING  TENDERNESS IN MOUTH AND THROAT WITH OR WITHOUT PRESENCE OF ULCERS  *URINARY PROBLEMS  *BOWEL PROBLEMS  UNUSUAL RASH Items with * indicate a potential emergency and should be followed up as soon as possible.  One of the nurses will contact you 24 hours after your treatment. Please let the nurse know about any problems that you may have experienced. Feel free to call the clinic you have any questions or concerns. The clinic phone number is [phone].   I have been informed and understand all the instructions given to me. I know to contact the clinic, my physician, or go to the Emergency Department if any problems should occur. I do not have any questions at this time, but understand that I may call the clinic during office hours   should I have any questions or need assistance in obtaining follow up care.    __________________________________________  _____________  __________ Signature of Patient or Authorized Representative            Date                   Time    __________________________________________ Nurse's  Signature

## 2012-06-30 ENCOUNTER — Other Ambulatory Visit: Payer: Self-pay | Admitting: Certified Registered Nurse Anesthetist

## 2012-06-30 ENCOUNTER — Telehealth: Payer: Self-pay | Admitting: *Deleted

## 2012-06-30 NOTE — Telephone Encounter (Signed)
Message from pt reporting pharmacy did not receive potassium rx. Rx called to pharmacy. Pt made aware.

## 2012-07-01 ENCOUNTER — Ambulatory Visit (HOSPITAL_BASED_OUTPATIENT_CLINIC_OR_DEPARTMENT_OTHER): Payer: Medicare Other

## 2012-07-01 VITALS — BP 123/70 | HR 86 | Temp 98.6°F

## 2012-07-01 DIAGNOSIS — C189 Malignant neoplasm of colon, unspecified: Secondary | ICD-10-CM

## 2012-07-01 DIAGNOSIS — C50919 Malignant neoplasm of unspecified site of unspecified female breast: Secondary | ICD-10-CM

## 2012-07-01 MED ORDER — HEPARIN SOD (PORK) LOCK FLUSH 100 UNIT/ML IV SOLN
250.0000 [IU] | Freq: Once | INTRAVENOUS | Status: DC | PRN
  Filled 2012-07-01: qty 5

## 2012-07-01 MED ORDER — SODIUM CHLORIDE 0.9 % IJ SOLN
10.0000 mL | INTRAMUSCULAR | Status: DC | PRN
Start: 2012-07-01 — End: 2012-07-01
  Administered 2012-07-01: 10 mL
  Filled 2012-07-01: qty 10

## 2012-07-01 MED ORDER — HEPARIN SOD (PORK) LOCK FLUSH 100 UNIT/ML IV SOLN
500.0000 [IU] | Freq: Once | INTRAVENOUS | Status: AC | PRN
  Administered 2012-07-01: 500 [IU]
  Filled 2012-07-01: qty 5

## 2012-07-02 ENCOUNTER — Telehealth: Payer: Self-pay | Admitting: Oncology

## 2012-07-02 NOTE — Telephone Encounter (Signed)
tx for April added. S/w pt and confirmed next appt for 3/26. Pt will get schedule when she comes in.

## 2012-07-03 ENCOUNTER — Telehealth: Payer: Self-pay | Admitting: *Deleted

## 2012-07-03 ENCOUNTER — Telehealth: Payer: Self-pay | Admitting: Dietician

## 2012-07-03 ENCOUNTER — Other Ambulatory Visit: Payer: Self-pay | Admitting: *Deleted

## 2012-07-03 MED ORDER — LOPERAMIDE HCL 2 MG PO CAPS: 2.0000 mg | ORAL_CAPSULE | Freq: Four times a day (QID) | ORAL | Status: DC | PRN

## 2012-07-03 NOTE — Telephone Encounter (Signed)
Brief Outpatient Oncology Nutrition Note  Patient has been identified to be at risk on malnutrition screen.   Wt Readings from Last 10 Encounters:  06/29/12 152 lb (68.947 kg)  06/06/12 159 lb (72.122 kg)  11/22/11 198 lb 14.4 oz (90.22 kg)    Called and spoke with patient secondary to weight loss of 23% in the last 7 months.  Patient states she is tolerating intake today.  Recently had chemo and diarrhea just resolved.  Patient stated that she took [employer] in the past.  Encouraged small frequent meals, ways to increase calories and protein and to begin use of the [employer] again.  Patient stated she is going to see how she does with the next treatment and if desired will make an appointment with the [facility] RD at that time.  Oran Rein, RD, LDN\

## 2012-07-03 NOTE — Telephone Encounter (Signed)
Received note that patient has had diarrhea over the weekend and needs script. Called patient and she reports diarrhea started day after the chemo infusion. Was told over weekend to take #2 Imodium after each loose stool and she was doing this and was taking up to 8/day and still having loose stools. Has only had one loose stool today. Told her she is probably over the Irinotecan-diarrhea now, but in future she needs to start the Imodium with the first loose stool. Mailed her directions on how to take Imodium for Irinotecan diarrhea so she will have for next cycle. Also will suggest the nurse give her Atropine at next treatment. Instructed her to call back if diarrhea returns.

## 2012-07-09 ENCOUNTER — Other Ambulatory Visit: Payer: Self-pay | Admitting: Oncology

## 2012-07-12 ENCOUNTER — Other Ambulatory Visit (HOSPITAL_BASED_OUTPATIENT_CLINIC_OR_DEPARTMENT_OTHER): Payer: Medicare Other | Admitting: Lab

## 2012-07-12 ENCOUNTER — Telehealth: Payer: Self-pay | Admitting: *Deleted

## 2012-07-12 ENCOUNTER — Telehealth: Payer: Self-pay | Admitting: Oncology

## 2012-07-12 ENCOUNTER — Ambulatory Visit (HOSPITAL_BASED_OUTPATIENT_CLINIC_OR_DEPARTMENT_OTHER): Payer: Medicare Other | Admitting: Nurse Practitioner

## 2012-07-12 ENCOUNTER — Ambulatory Visit (HOSPITAL_BASED_OUTPATIENT_CLINIC_OR_DEPARTMENT_OTHER): Payer: Medicare Other

## 2012-07-12 VITALS — BP 115/70 | HR 105 | Temp 98.1°F | Resp 18 | Ht 63.0 in | Wt 145.4 lb

## 2012-07-12 DIAGNOSIS — C50919 Malignant neoplasm of unspecified site of unspecified female breast: Secondary | ICD-10-CM

## 2012-07-12 DIAGNOSIS — Z5111 Encounter for antineoplastic chemotherapy: Secondary | ICD-10-CM

## 2012-07-12 DIAGNOSIS — Z5112 Encounter for antineoplastic immunotherapy: Secondary | ICD-10-CM

## 2012-07-12 DIAGNOSIS — C189 Malignant neoplasm of colon, unspecified: Secondary | ICD-10-CM

## 2012-07-12 DIAGNOSIS — R634 Abnormal weight loss: Secondary | ICD-10-CM

## 2012-07-12 DIAGNOSIS — C779 Secondary and unspecified malignant neoplasm of lymph node, unspecified: Secondary | ICD-10-CM

## 2012-07-12 LAB — CBC WITH DIFFERENTIAL/PLATELET
BASO%: 0.7 % (ref 0.0–2.0)
EOS%: 3.5 % (ref 0.0–7.0)
MCH: 26.5 pg (ref 25.1–34.0)
MCHC: 31.6 g/dL (ref 31.5–36.0)
NEUT%: 29.1 % — ABNORMAL LOW (ref 38.4–76.8)
RBC: 4.07 10*6/uL (ref 3.70–5.45)
RDW: 14.6 % — ABNORMAL HIGH (ref 11.2–14.5)
lymph#: 2 10*3/uL (ref 0.9–3.3)
nRBC: 0 % (ref 0–0)

## 2012-07-12 LAB — UA PROTEIN, DIPSTICK - CHCC: Protein, ur: NEGATIVE mg/dL

## 2012-07-12 LAB — COMPREHENSIVE METABOLIC PANEL (CC13)
AST: 12 U/L (ref 5–34)
Albumin: 3 g/dL — ABNORMAL LOW (ref 3.5–5.0)
Alkaline Phosphatase: 96 U/L (ref 40–150)
BUN: 13.5 mg/dL (ref 7.0–26.0)
Potassium: 3.6 mEq/L (ref 3.5–5.1)
Sodium: 137 mEq/L (ref 136–145)

## 2012-07-12 MED ORDER — ONDANSETRON 16 MG/50ML IVPB (CHCC)
16.0000 mg | Freq: Once | INTRAVENOUS | Status: AC
  Administered 2012-07-12: 16 mg via INTRAVENOUS

## 2012-07-12 MED ORDER — LEUCOVORIN CALCIUM INJECTION 350 MG
400.0000 mg/m2 | Freq: Once | INTRAVENOUS | Status: AC
  Administered 2012-07-12: 716 mg via INTRAVENOUS
  Filled 2012-07-12: qty 35.8

## 2012-07-12 MED ORDER — DEXAMETHASONE SODIUM PHOSPHATE 4 MG/ML IJ SOLN
20.0000 mg | Freq: Once | INTRAMUSCULAR | Status: AC
  Administered 2012-07-12: 20 mg via INTRAVENOUS

## 2012-07-12 MED ORDER — SODIUM CHLORIDE 0.9 % IV SOLN
2400.0000 mg/m2 | INTRAVENOUS | Status: DC
  Administered 2012-07-12: 4300 mg via INTRAVENOUS
  Filled 2012-07-12: qty 86

## 2012-07-12 MED ORDER — FLUOROURACIL CHEMO INJECTION 2.5 GM/50ML
400.0000 mg/m2 | Freq: Once | INTRAVENOUS | Status: AC
  Administered 2012-07-12: 700 mg via INTRAVENOUS
  Filled 2012-07-12: qty 14

## 2012-07-12 MED ORDER — SODIUM CHLORIDE 0.9 % IV SOLN
5.0000 mg/kg | Freq: Once | INTRAVENOUS | Status: AC
  Administered 2012-07-12: 350 mg via INTRAVENOUS
  Filled 2012-07-12: qty 14

## 2012-07-12 MED ORDER — ATROPINE SULFATE 1 MG/ML IJ SOLN
0.5000 mg | Freq: Once | INTRAMUSCULAR | Status: AC | PRN
  Administered 2012-07-12: 0.5 mg via INTRAVENOUS

## 2012-07-12 MED ORDER — SODIUM CHLORIDE 0.9 % IV SOLN
Freq: Once | INTRAVENOUS | Status: AC
  Administered 2012-07-12: 11:00:00 via INTRAVENOUS

## 2012-07-12 MED ORDER — IRINOTECAN HCL CHEMO INJECTION 100 MG/5ML
180.0000 mg/m2 | Freq: Once | INTRAVENOUS | Status: AC
  Administered 2012-07-12: 322 mg via INTRAVENOUS
  Filled 2012-07-12: qty 16.1

## 2012-07-12 NOTE — Telephone Encounter (Signed)
Per staff phone call and POF I have schedueld appts.  JMW  

## 2012-07-12 NOTE — Patient Instructions (Addendum)
[id number] Discharge Instructions for Patients Receiving Chemotherapy  Today you received the following chemotherapy agents :  Camptosar, Leucovorin, 5 FU, Avastin.  To help prevent nausea and vomiting after your treatment, we encourage you to take your nausea medication as instructed by your physician. .   If you develop nausea and vomiting that is not controlled by your nausea medication, call the clinic. If it is after clinic hours your family physician or the after hours number for the clinic or go to the Emergency Department.   BELOW ARE SYMPTOMS THAT SHOULD BE REPORTED IMMEDIATELY:  *FEVER GREATER THAN 100.5 F  *CHILLS WITH OR WITHOUT FEVER  NAUSEA AND VOMITING THAT IS NOT CONTROLLED WITH YOUR NAUSEA MEDICATION  *UNUSUAL SHORTNESS OF BREATH  *UNUSUAL BRUISING OR BLEEDING  TENDERNESS IN MOUTH AND THROAT WITH OR WITHOUT PRESENCE OF ULCERS  *URINARY PROBLEMS  *BOWEL PROBLEMS  UNUSUAL RASH Items with * indicate a potential emergency and should be followed up as soon as possible.  One of the nurses will contact you 24 hours after your treatment. Please let the nurse know about any problems that you may have experienced. Feel free to call the clinic you have any questions or concerns. The clinic phone number is [phone].   I have been informed and understand all the instructions given to me. I know to contact the clinic, my physician, or go to the Emergency Department if any problems should occur. I do not have any questions at this time, but understand that I may call the clinic during office hours   should I have any questions or need assistance in obtaining follow up care.    __________________________________________  _____________  __________ Signature of Patient or Authorized Representative            Date                   Time    __________________________________________ Nurse's Signature

## 2012-07-12 NOTE — Progress Notes (Signed)
OFFICE PROGRESS NOTE  Interval history:  Kristen Alexander returns as scheduled. She completed cycle 2 FOLFIRI/Avastin on 06/29/2012. She developed crampy abdominal pain during the irinotecan infusion. She received atropine with resolution of the symptoms. She had mild nausea. No vomiting. No mouth sores. She developed diarrhea the day following chemotherapy. The diarrhea lasted for 2 days and was controlled with Imodium.  She denies bleeding. No shortness of breath or chest pain. No leg swelling or calf pain. She has intermittent crampy abdominal pain.   Objective: Blood pressure 115/70, pulse 105, temperature 98.1 F (36.7 C), temperature source Oral, resp. rate 18, height 5\' 3"  (1.6 m), weight 145 lb 6.4 oz (65.953 kg).  Oropharynx is without thrush or ulceration. Lungs are clear. Regular cardiac rhythm. Port-A-Cath site is without erythema. Abdomen is soft and nontender. No hepatomegaly. Extremities without edema. Calves are soft and nontender.  Lab Results: Lab Results  Component Value Date   WBC 4.3 07/12/2012   HGB 10.8* 07/12/2012   HCT 34.2* 07/12/2012   MCV 84.0 07/12/2012   PLT 494* 07/12/2012    Chemistry:    Chemistry      Component Value Date/Time   NA 141 06/29/2012 0903   K 3.0 Repeated and Verified* 06/29/2012 0903   CL 108* 06/29/2012 0903   CO2 25 06/29/2012 0903   BUN 15.1 06/29/2012 0903   CREATININE 0.6 06/29/2012 0903      Component Value Date/Time   CALCIUM 8.8 06/29/2012 0903   ALKPHOS 98 06/29/2012 0903   AST 13 06/29/2012 0903   ALT 14 06/29/2012 0903   BILITOT 0.20 06/29/2012 0903       Studies/Results: Dg Chest 2 View  06/13/2012  *RADIOLOGY REPORT*  Clinical Data: PICC line placement.  CHEST - 2 VIEW  Comparison: None.  Findings: The cardiac silhouette, mediastinal and hilar contours are normal.  The lungs are clear.  No pleural effusion.  The left PICC line tip is in the mid distal SVC just below the level of the carina.  No complicating features.  The bony  thorax is intact.  IMPRESSION:  1.  No acute cardiopulmonary findings. 2.  Left PICC line tip is in the mid distal SVC.   Original Report Authenticated By: Rudie Meyer, M.D.     Medications: I have reviewed the patient's current medications.  Assessment/Plan:  1. 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 Status post FOLFOX (3 cycles) with progressive nausea and weight loss. Chemotherapy switched to FOLFIRI (2 cycles). Admission with a bowel obstruction December 2013 status post jejuno-colic anastomosis with resolution of obstructive symptoms, progressive carcinomatosis noted at the time of surgery. Status post FOLFIRI/Avastin cycle 1 on 06/14/2012. 2. History of a microcytic anemia. Likely iron deficiency. 3. Weight loss secondary to progressive metastatic colon cancer with a bowel obstruction. 4. Left arm passport. 5. Crampy abdominal pain during the irinotecan infusion cycle 2 relieved with atropine. 6. Diarrhea following cycle 2 FOLFIRI/Avastin. The diarrhea was controlled with Imodium. 7. Mild decrease in absolute neutrophil count on labs today.  Disposition-she has completed 2 cycles of FOLFIRI/Avastin. She had diarrhea following cycle 2, likely irinotecan-induced. The diarrhea was controlled with Imodium. The absolute neutrophil is mildly decreased today. Plan to proceed with cycle 3 FOLFIRI/Avastin today as scheduled. She will received a Neulasta injection on the day of pump discontinuation. If she again develops diarrhea she  will begin Imodium. If the Imodium is not effective she will contact the office. She will return for a followup visit in 2 weeks.  Plan reviewed with Dr. Truett Perna.  Lonna Cobb ANP/GNP-BC

## 2012-07-13 ENCOUNTER — Other Ambulatory Visit: Payer: Self-pay | Admitting: Certified Registered Nurse Anesthetist

## 2012-07-14 ENCOUNTER — Telehealth: Payer: Self-pay | Admitting: Dietician

## 2012-07-14 ENCOUNTER — Ambulatory Visit (HOSPITAL_BASED_OUTPATIENT_CLINIC_OR_DEPARTMENT_OTHER): Payer: Medicare Other

## 2012-07-14 VITALS — BP 110/72 | HR 94 | Temp 98.0°F | Resp 18

## 2012-07-14 DIAGNOSIS — C189 Malignant neoplasm of colon, unspecified: Secondary | ICD-10-CM

## 2012-07-14 DIAGNOSIS — Z5189 Encounter for other specified aftercare: Secondary | ICD-10-CM

## 2012-07-14 MED ORDER — PEGFILGRASTIM INJECTION 6 MG/0.6ML
6.0000 mg | Freq: Once | SUBCUTANEOUS | Status: AC
  Administered 2012-07-14: 6 mg via SUBCUTANEOUS
  Filled 2012-07-14: qty 0.6

## 2012-07-14 MED ORDER — HEPARIN SOD (PORK) LOCK FLUSH 100 UNIT/ML IV SOLN
500.0000 [IU] | Freq: Once | INTRAVENOUS | Status: AC | PRN
  Administered 2012-07-14: 500 [IU]
  Filled 2012-07-14: qty 5

## 2012-07-14 MED ORDER — SODIUM CHLORIDE 0.9 % IJ SOLN
10.0000 mL | INTRAMUSCULAR | Status: DC | PRN
  Administered 2012-07-14: 10 mL
  Filled 2012-07-14: qty 10

## 2012-07-14 NOTE — Patient Instructions (Signed)
Call MD for problems 

## 2012-07-19 ENCOUNTER — Emergency Department (HOSPITAL_COMMUNITY): Payer: Medicare Other

## 2012-07-19 ENCOUNTER — Inpatient Hospital Stay (HOSPITAL_COMMUNITY)
Admission: EM | Admit: 2012-07-19 | Discharge: 2012-07-21 | DRG: 392 | Disposition: A | Discharge status: Home / Self Care | Payer: Medicare Other | Attending: Internal Medicine | Admitting: Internal Medicine

## 2012-07-19 ENCOUNTER — Encounter (HOSPITAL_COMMUNITY): Payer: Self-pay

## 2012-07-19 ENCOUNTER — Telehealth: Payer: Self-pay | Admitting: *Deleted

## 2012-07-19 DIAGNOSIS — C800 Disseminated malignant neoplasm, unspecified: Secondary | ICD-10-CM

## 2012-07-19 DIAGNOSIS — N390 Urinary tract infection, site not specified: Secondary | ICD-10-CM | POA: Diagnosis present

## 2012-07-19 DIAGNOSIS — D630 Anemia in neoplastic disease: Secondary | ICD-10-CM | POA: Diagnosis present

## 2012-07-19 DIAGNOSIS — R1115 Cyclical vomiting syndrome unrelated to migraine: Secondary | ICD-10-CM | POA: Diagnosis present

## 2012-07-19 DIAGNOSIS — T451X5A Adverse effect of antineoplastic and immunosuppressive drugs, initial encounter: Secondary | ICD-10-CM | POA: Diagnosis present

## 2012-07-19 DIAGNOSIS — E860 Dehydration: Secondary | ICD-10-CM

## 2012-07-19 DIAGNOSIS — Z9049 Acquired absence of other specified parts of digestive tract: Secondary | ICD-10-CM

## 2012-07-19 DIAGNOSIS — M6281 Muscle weakness (generalized): Secondary | ICD-10-CM

## 2012-07-19 DIAGNOSIS — E871 Hypo-osmolality and hyponatremia: Secondary | ICD-10-CM

## 2012-07-19 DIAGNOSIS — D6481 Anemia due to antineoplastic chemotherapy: Secondary | ICD-10-CM | POA: Diagnosis present

## 2012-07-19 DIAGNOSIS — R112 Nausea with vomiting, unspecified: Secondary | ICD-10-CM

## 2012-07-19 DIAGNOSIS — C189 Malignant neoplasm of colon, unspecified: Secondary | ICD-10-CM

## 2012-07-19 DIAGNOSIS — R509 Fever, unspecified: Secondary | ICD-10-CM

## 2012-07-19 DIAGNOSIS — D649 Anemia, unspecified: Secondary | ICD-10-CM

## 2012-07-19 DIAGNOSIS — K5289 Other specified noninfective gastroenteritis and colitis: Principal | ICD-10-CM | POA: Diagnosis present

## 2012-07-19 DIAGNOSIS — C801 Malignant (primary) neoplasm, unspecified: Secondary | ICD-10-CM | POA: Diagnosis present

## 2012-07-19 DIAGNOSIS — K56609 Unspecified intestinal obstruction, unspecified as to partial versus complete obstruction: Secondary | ICD-10-CM | POA: Diagnosis present

## 2012-07-19 DIAGNOSIS — E236 Other disorders of pituitary gland: Secondary | ICD-10-CM | POA: Diagnosis present

## 2012-07-19 DIAGNOSIS — I951 Orthostatic hypotension: Secondary | ICD-10-CM

## 2012-07-19 HISTORY — DX: Malignant neoplasm of colon, unspecified: C18.9

## 2012-07-19 HISTORY — DX: Anemia, unspecified: D64.9

## 2012-07-19 LAB — COMPREHENSIVE METABOLIC PANEL
ALT: 8 U/L (ref 0–35)
Albumin: 3.3 g/dL — ABNORMAL LOW (ref 3.5–5.2)
Alkaline Phosphatase: 125 U/L — ABNORMAL HIGH (ref 39–117)
BUN: 18 mg/dL (ref 6–23)
Chloride: 95 mEq/L — ABNORMAL LOW (ref 96–112)
GFR calc Af Amer: 86 mL/min — ABNORMAL LOW (ref >90)
Glucose, Bld: 103 mg/dL — ABNORMAL HIGH (ref 70–99)
Potassium: 3.7 mEq/L (ref 3.5–5.1)
Total Bilirubin: 0.3 mg/dL (ref 0.3–1.2)

## 2012-07-19 LAB — CBC WITH DIFFERENTIAL/PLATELET
Basophils Relative: 0 % (ref 0–1)
HCT: 32.9 % — ABNORMAL LOW (ref 36.0–46.0)
Hemoglobin: 10.9 g/dL — ABNORMAL LOW (ref 12.0–15.0)
Lymphocytes Relative: 29 % (ref 12–46)
Monocytes Relative: 15 % — ABNORMAL HIGH (ref 3–12)
Neutro Abs: 3.6 10*3/uL (ref 1.7–7.7)
RBC: 4.07 MIL/uL (ref 3.87–5.11)
WBC: 6.6 10*3/uL (ref 4.0–10.5)

## 2012-07-19 LAB — LIPASE, BLOOD: Lipase: 11 U/L (ref 11–59)

## 2012-07-19 MED ORDER — ONDANSETRON HCL 4 MG/2ML IJ SOLN
4.0000 mg | INTRAMUSCULAR | Status: AC | PRN
  Administered 2012-07-19 – 2012-07-20 (×2): 4 mg via INTRAVENOUS
  Filled 2012-07-19 (×2): qty 2

## 2012-07-19 MED ORDER — SODIUM CHLORIDE 0.9 % IV SOLN
INTRAVENOUS | Status: DC
  Administered 2012-07-19: via INTRAVENOUS

## 2012-07-19 MED ORDER — DICYCLOMINE HCL 10 MG/ML IM SOLN
20.0000 mg | Freq: Once | INTRAMUSCULAR | Status: AC
  Administered 2012-07-20: 20 mg via INTRAMUSCULAR
  Filled 2012-07-19: qty 2

## 2012-07-19 NOTE — Telephone Encounter (Signed)
Friend reports patient has felt more nausea recently despite taking her Zofran ODT every 6-8 hours. Occasionally vomits small amounts. Not eating and only drinking about 32 ounces liquid entire day. Is very weak and seems to be sleeping a lot more. Able to ambulate, but is weak and not as steady on her feet. Feels lightheaded when OOB. Not having diarrhea. Does not feel she needs to be hospitalized, but wants her to have IV fluids asap and is asking for this to be done today. Made her aware time is too late for outpatient IVF in office, but if she feels condition warrants it, they could transport her to emergency department. She will discuss with patient. This RN will make MD aware of status tomorrow and attempt to arrange for IVF in office.

## 2012-07-19 NOTE — ED Notes (Signed)
Pt had last chemo treatment one week ago and since has felt weak, nauseated, sleeping all the time but not sleeping good

## 2012-07-19 NOTE — ED Provider Notes (Signed)
History     CSN: [id number]  Arrival date & time 07/19/12  2044   First MD Initiated Contact with Patient 07/19/12 2146      Chief Complaint  Patient presents with  . Weakness  . Nausea  . Emesis     HPI Pt was seen at 2200.   Per pt, c/o gradual onset and worsening of multiple intermittent episodes of N/V for the past 1 week.  Pt states she has been taking her home anti-emetic without relief.  States her symptoms began after her LD chemo 1 week ago.  Has been associated with generalized weakness/fatigue.  Denies abd pain, no diarrhea, no fevers, no CP/SOB, no back pain.     Past Medical History  Diagnosis Date  . 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 Appendectomy    . Colon resection    . Knee arthroscopy Left      History  Substance Use Topics  . Smoking status: Never Smoker   . Smokeless tobacco: Not on file  . Alcohol Use: No      Review of Systems ROS: Statement: All systems negative except as marked or noted in the HPI; Constitutional: Negative for fever and chills. +generalized weakness/fatigue. ; ; Eyes: Negative for eye pain, redness and discharge. ; ; ENMT: Negative for ear pain, hoarseness, nasal congestion, sinus pressure and sore throat. ; ; Cardiovascular: Negative for chest pain, palpitations, diaphoresis, dyspnea and peripheral edema. ; ; Respiratory: Negative for cough, wheezing and stridor. ; ; Gastrointestinal: +N/V. Negative for abdominal pain, diarrhea, blood in stool, hematemesis, jaundice and rectal bleeding. . ; ; Genitourinary: Negative for dysuria, flank pain and hematuria. ; ; Musculoskeletal: Negative for back pain and neck pain. Negative for swelling and trauma.; ; Skin: Negative for pruritus, rash, abrasions, blisters, bruising and skin lesion.; ; Neuro: Negative for headache, lightheadedness and neck stiffness. Negative for weakness, altered level of consciousness , altered  mental status, extremity weakness, paresthesias, involuntary movement, seizure and syncope.       Allergies  Ativan and Benadryl  Home Medications   Current Outpatient Rx  Name  Route  Sig  Dispense  Refill  . loperamide (IMODIUM) 2 MG capsule   Oral   Take 1 capsule (2 mg total) by mouth 4 (four) times daily as needed for diarrhea or loose stools (or as directed for irinotecan diarrhea).      0   . ondansetron (ZOFRAN-ODT) 8 MG disintegrating tablet   Oral   Take 1 tablet (8 mg total) by mouth every 8 (eight) hours as needed for nausea.   20 tablet   3   . Oxycodone HCl 10 MG TABS   Oral   Take 1 tablet (10 mg total) by mouth every 4 (four) hours as needed (pain).   75 tablet   0   . pegfilgrastim (NEULASTA) 6 MG/0.6ML injection   Subcutaneous   Inject 6 mg into the skin every 14 (fourteen) days.         . potassium chloride SA (K-DUR,KLOR-CON) 20 MEQ tablet   Oral   Take 1 tablet (20 mEq total) by mouth daily. Take one by mouth twice today (06/29/12), then one daily   30 tablet   1   . PRESCRIPTION MEDICATION      Receiving chemotherapy at [facility] (Dr. Truett Perna).  Regimen is Avastin/FOLFIRI  every 14 days.  Last treatment was 07/12/12, next planned for  07/26/12.           BP 138/71  Pulse 82  Temp(Src) 99.4 F (37.4 C) (Oral)  Resp 16  SpO2 100%  Physical Exam 2205: Physical examination:  Nursing notes reviewed; Vital signs and O2 SAT reviewed;  Constitutional: Well developed, Well nourished, In no acute distress; Head:  Normocephalic, atraumatic; Eyes: EOMI, PERRL, No scleral icterus; ENMT: Mouth and pharynx normal, Mucous membranes dry; Neck: Supple, Full range of motion, No lymphadenopathy; Cardiovascular: Regular rate and rhythm, No murmur, rub, or gallop; Respiratory: Breath sounds clear & equal bilaterally, No rales, rhonchi, wheezes.  Speaking full sentences with ease, Normal respiratory effort/excursion; Chest: Nontender, Movement normal; Abdomen: Soft,  Nontender, Nondistended, Normal bowel sounds; Genitourinary: No CVA tenderness; Extremities: Pulses normal, No tenderness, No edema, No calf edema or asymmetry.; Neuro: AA&Ox3, Major CN grossly intact.  Speech clear. No gross focal motor or sensory deficits in extremities.; Skin: Color normal, Warm, Dry.   ED Course  Procedures     MDM  MDM Reviewed: previous chart, nursing note and vitals Reviewed previous: labs Interpretation: labs and x-ray   Results for orders placed during the hospital encounter of 07/19/12  CBC WITH DIFFERENTIAL      Result Value Range   WBC 6.6  4.0 - 10.5 K/uL   RBC 4.07  3.87 - 5.11 MIL/uL   Hemoglobin 10.9 (*) 12.0 - 15.0 g/dL   HCT 40.9 (*) 81.1 - 91.4 %   MCV 80.8  78.0 - 100.0 fL   MCH 26.8  26.0 - 34.0 pg   MCHC 33.1  30.0 - 36.0 g/dL   RDW 78.2  95.6 - 21.3 %   Platelets 341  150 - 400 K/uL   Neutrophils Relative 54  43 - 77 %   Lymphocytes Relative 29  12 - 46 %   Monocytes Relative 15 (*) 3 - 12 %   Eosinophils Relative 2  0 - 5 %   Basophils Relative 0  0 - 1 %   Neutro Abs 3.6  1.7 - 7.7 K/uL   Lymphs Abs 1.9  0.7 - 4.0 K/uL   Monocytes Absolute 1.0  0.1 - 1.0 K/uL   Eosinophils Absolute 0.1  0.0 - 0.7 K/uL   Basophils Absolute 0.0  0.0 - 0.1 K/uL   WBC Morphology TOXIC GRANULATION    COMPREHENSIVE METABOLIC PANEL      Result Value Range   Sodium 131 (*) 135 - 145 mEq/L   Potassium 3.7  3.5 - 5.1 mEq/L   Chloride 95 (*) 96 - 112 mEq/L   CO2 26  19 - 32 mEq/L   Glucose, Bld 103 (*) 70 - 99 mg/dL   BUN 18  6 - 23 mg/dL   Creatinine, Ser 0.86  0.50 - 1.10 mg/dL   Calcium 9.3  8.4 - 57.8 mg/dL   Total Protein 6.6  6.0 - 8.3 g/dL   Albumin 3.3 (*) 3.5 - 5.2 g/dL   AST 12  0 - 37 U/L   ALT 8  0 - 35 U/L   Alkaline Phosphatase 125 (*) 39 - 117 U/L   Total Bilirubin 0.3  0.3 - 1.2 mg/dL   GFR calc non Af Amer 75 (*) >90 mL/min   GFR calc Af Amer 86 (*) >90 mL/min  LIPASE, BLOOD      Result Value Range   Lipase 11  11 - 59 U/L   LACTIC ACID, PLASMA      Result Value Range  Lactic Acid, Venous 1.3  0.5 - 2.2 mmol/L   Dg Abd Acute W/chest 07/19/2012  *RADIOLOGY REPORT*  Clinical Data: Nausea and vomiting.  History colon cancer.  ACUTE ABDOMEN SERIES (ABDOMEN 2 VIEW & CHEST 1 VIEW)  Comparison: Chest 06/13/2012  Findings: Stable appearance of left PICC catheter. The heart size and pulmonary vascularity are normal. The lungs appear clear and expanded without focal air space disease or consolidation. No blunting of the costophrenic angles.  No pneumothorax.  Mediastinal contours appear intact.  Surgical clips in the mid and lower abdomen.  Scattered gas in the colon and small bowel without small or large bowel distension.  No free intra-abdominal air.  No abnormal air fluid levels.  No radiopaque stones demonstrated.  Visualized bones appear intact.  IMPRESSION: No evidence of active pulmonary disease.  Nonobstructive bowel gas pattern.   Original Report Authenticated By: Burman Nieves, M.D.    Results for MESHELLE, HOLNESS (MRN [id number]) as of 07/19/2012 23:41  Ref. Range 06/08/2012 12:01 06/29/2012 09:03 07/12/2012 08:40 07/19/2012 22:45  Hemoglobin Latest Range: 12.0-15.0 g/dL 78.2 (L) 95.6 (L) 21.3 (L) 10.9 (L)  HCT Latest Range: 36.0-46.0 % 33.2 (L) 33.5 (L) 34.2 (L) 32.9 (L)      2330:  Pt orthostatic with SBP dropping from 130's to 100's when standing, HR increasing from 90's to 110's.  Rectal temp 100.4.  Will obtain UC and BC. Pt agreeable now to in and out cath to obtain urine.  Continues to c/o nausea and has vomited despite IV meds, will remedicate and admit. Pt also c/o abd "cramping," will medicate with IM bentyl. Dx and testing d/w pt and family.  Questions answered.  Verb understanding, agreeable to admit.    2355:  T/C to [facility] Dr. Sharyon Medicus, case discussed, including:  HPI, pertinent PM/SHx, VS/PE, dx testing, ED course and treatment:  Agreeable to admit, aware UA/UC is pending.        Laray Anger,  DO 07/20/12 2120

## 2012-07-20 ENCOUNTER — Encounter (HOSPITAL_COMMUNITY): Payer: Self-pay | Admitting: *Deleted

## 2012-07-20 ENCOUNTER — Inpatient Hospital Stay (HOSPITAL_COMMUNITY): Payer: Medicare Other

## 2012-07-20 ENCOUNTER — Ambulatory Visit (HOSPITAL_COMMUNITY): Payer: Medicare Other

## 2012-07-20 DIAGNOSIS — C800 Disseminated malignant neoplasm, unspecified: Secondary | ICD-10-CM | POA: Diagnosis present

## 2012-07-20 DIAGNOSIS — E871 Hypo-osmolality and hyponatremia: Secondary | ICD-10-CM | POA: Diagnosis present

## 2012-07-20 DIAGNOSIS — D649 Anemia, unspecified: Secondary | ICD-10-CM | POA: Diagnosis present

## 2012-07-20 DIAGNOSIS — K56609 Unspecified intestinal obstruction, unspecified as to partial versus complete obstruction: Secondary | ICD-10-CM | POA: Diagnosis present

## 2012-07-20 DIAGNOSIS — E860 Dehydration: Secondary | ICD-10-CM | POA: Diagnosis present

## 2012-07-20 DIAGNOSIS — C801 Malignant (primary) neoplasm, unspecified: Secondary | ICD-10-CM

## 2012-07-20 DIAGNOSIS — R1115 Cyclical vomiting syndrome unrelated to migraine: Secondary | ICD-10-CM

## 2012-07-20 DIAGNOSIS — R112 Nausea with vomiting, unspecified: Secondary | ICD-10-CM | POA: Diagnosis present

## 2012-07-20 DIAGNOSIS — R509 Fever, unspecified: Secondary | ICD-10-CM | POA: Diagnosis present

## 2012-07-20 DIAGNOSIS — C785 Secondary malignant neoplasm of large intestine and rectum: Secondary | ICD-10-CM

## 2012-07-20 LAB — URINALYSIS, ROUTINE W REFLEX MICROSCOPIC
Ketones, ur: 15 mg/dL — AB
Nitrite: NEGATIVE
Protein, ur: 30 mg/dL — AB
Urobilinogen, UA: 0.2 mg/dL (ref 0.0–1.0)
pH: 6 (ref 5.0–8.0)

## 2012-07-20 LAB — URINE MICROSCOPIC-ADD ON

## 2012-07-20 MED ORDER — ONDANSETRON HCL 4 MG/2ML IJ SOLN: 4.0000 mg | Freq: Three times a day (TID) | INTRAMUSCULAR | Status: DC | PRN

## 2012-07-20 MED ORDER — SODIUM CHLORIDE 0.9 % IV SOLN: INTRAVENOUS | Status: DC

## 2012-07-20 MED ORDER — ADULT MULTIVITAMIN W/MINERALS CH
1.0000 | ORAL_TABLET | Freq: Every day | ORAL | Status: DC
  Administered 2012-07-20 – 2012-07-21 (×2): 1 via ORAL
  Filled 2012-07-20 (×2): qty 1

## 2012-07-20 MED ORDER — HYDROCODONE-ACETAMINOPHEN 5-325 MG PO TABS
1.0000 | ORAL_TABLET | ORAL | Status: DC | PRN
  Administered 2012-07-20: 2 via ORAL
  Filled 2012-07-20: qty 2

## 2012-07-20 MED ORDER — ENOXAPARIN SODIUM 40 MG/0.4ML ~~LOC~~ SOLN
40.0000 mg | SUBCUTANEOUS | Status: DC
  Administered 2012-07-20 – 2012-07-21 (×2): 40 mg via SUBCUTANEOUS
  Filled 2012-07-20 (×2): qty 0.4

## 2012-07-20 MED ORDER — ZOLPIDEM TARTRATE 5 MG PO TABS
5.0000 mg | ORAL_TABLET | Freq: Every evening | ORAL | Status: DC | PRN
  Administered 2012-07-20: 5 mg via ORAL
  Filled 2012-07-20: qty 1

## 2012-07-20 MED ORDER — ALPRAZOLAM 0.25 MG PO TABS
0.2500 mg | ORAL_TABLET | Freq: Three times a day (TID) | ORAL | Status: DC | PRN
  Administered 2012-07-20 (×2): 0.25 mg via ORAL
  Filled 2012-07-20 (×2): qty 1

## 2012-07-20 MED ORDER — ONDANSETRON HCL 4 MG/2ML IJ SOLN
4.0000 mg | Freq: Four times a day (QID) | INTRAMUSCULAR | Status: DC | PRN
  Administered 2012-07-20 – 2012-07-21 (×6): 4 mg via INTRAVENOUS
  Filled 2012-07-20 (×6): qty 2

## 2012-07-20 MED ORDER — HYDROMORPHONE HCL PF 1 MG/ML IJ SOLN
1.0000 mg | INTRAMUSCULAR | Status: DC | PRN
  Administered 2012-07-20 – 2012-07-21 (×8): 1 mg via INTRAVENOUS
  Filled 2012-07-20 (×8): qty 1

## 2012-07-20 MED ORDER — DEXAMETHASONE SODIUM PHOSPHATE 4 MG/ML IJ SOLN
8.0000 mg | Freq: Two times a day (BID) | INTRAMUSCULAR | Status: AC
  Administered 2012-07-20 – 2012-07-21 (×3): 8 mg via INTRAVENOUS
  Filled 2012-07-20 (×4): qty 2

## 2012-07-20 MED ORDER — LEVOFLOXACIN IN D5W 500 MG/100ML IV SOLN
500.0000 mg | INTRAVENOUS | Status: DC
  Administered 2012-07-20 (×2): 500 mg via INTRAVENOUS
  Filled 2012-07-20 (×4): qty 100

## 2012-07-20 MED ORDER — BIOTENE DRY MOUTH MT LIQD
15.0000 mL | Freq: Two times a day (BID) | OROMUCOSAL | Status: DC
  Administered 2012-07-20 – 2012-07-21 (×3): 15 mL via OROMUCOSAL

## 2012-07-20 MED ORDER — POTASSIUM CHLORIDE IN NACL 20-0.9 MEQ/L-% IV SOLN
INTRAVENOUS | Status: DC
  Administered 2012-07-20 – 2012-07-21 (×3): via INTRAVENOUS
  Filled 2012-07-20 (×5): qty 1000

## 2012-07-20 MED ORDER — ACETAMINOPHEN 650 MG RE SUPP: 650.0000 mg | Freq: Four times a day (QID) | RECTAL | Status: DC | PRN

## 2012-07-20 MED ORDER — IOHEXOL 300 MG/ML  SOLN
25.0000 mL | INTRAMUSCULAR | Status: AC
  Administered 2012-07-20 (×2): 25 mL via ORAL

## 2012-07-20 MED ORDER — ACETAMINOPHEN 325 MG PO TABS: 650.0000 mg | ORAL_TABLET | Freq: Four times a day (QID) | ORAL | Status: DC | PRN

## 2012-07-20 MED ORDER — ONDANSETRON HCL 4 MG PO TABS: 4.0000 mg | ORAL_TABLET | Freq: Four times a day (QID) | ORAL | Status: DC | PRN

## 2012-07-20 NOTE — Progress Notes (Signed)
INITIAL NUTRITION ASSESSMENT  Pt meets criteria for severe MALNUTRITION in the context of chronic illness as evidenced by <75% estimated energy intake in the past month with 30.8% weight loss in the past 8 months.  DOCUMENTATION CODES Per approved criteria  -Severe malnutrition in the context of chronic illness   INTERVENTION: - [employer] instant breakfast BID - Encouraged increased PO intake as nausea improves - Multivitamin 1 tablet PO daily - Will continue to monitor   NUTRITION DIAGNOSIS: Inadequate oral intake related to nausea/vomiting as evidenced by <50% meal intake.   Goal: 1. Resolution of nausea/vomiting 2. Pt to consume >50% of meals/supplements   Monitor:  Weights, labs, intake, nausea/vomiting  Reason for Assessment: Nutrition risk   67 y.o. female  Admitting Dx: Intractable nausea and vomiting  ASSESSMENT: Pt with history of metastatic cecum/appendix carcinoma, currently on chemotherapy, last treatment 07/12/2012. She reports developing nausea beginning on day 3 following chemotherapy. The nausea progressed over the past several days.   Met with pt who reports 7 pound unintended weight loss in the past week r/t nausea/vomiting. Pt reports emesis occurred 3 times/day and was bile/mucous in consistency. Pt reports she has not been able eat very much during this time frame. Pt reports she was drinking [employer] at home for additional nutrition. Pt reports nausea improved today with medication and states last emesis was 4:30am this morning. Pt able to eat few bites of breakfast this morning. Pt's weight has gone down from 198 pounds in August 2013 to 137 pounds during current admission. Pt has been contacted by [facility] RD last month and given contact information to call for any nutritional concerns.     Height: Ht Readings from Last 1 Encounters:  07/20/12 5\' 3"  (1.6 m)    Weight: Wt Readings from Last 1 Encounters:  07/20/12  137 lb 4.8 oz (62.279 kg)    Ideal Body Weight: 115 lb  % Ideal Body Weight: 119  Wt Readings from Last 10 Encounters:  07/20/12 137 lb 4.8 oz (62.279 kg)  07/12/12 145 lb 6.4 oz (65.953 kg)  06/29/12 152 lb (68.947 kg)  06/06/12 159 lb (72.122 kg)  11/22/11 198 lb 14.4 oz (90.22 kg)    Usual Body Weight: 144 lb per pt  % Usual Body Weight: 95  BMI:  Body mass index is 24.33 kg/(m^2).  Estimated Nutritional Needs: Kcal: 1550-1850 Protein: 65-75g Fluid: 1.5-1.8L/day  Skin: Intact  Diet Order: Parke Simmers  EDUCATION NEEDS: -No education needs identified at this time   Intake/Output Summary (Last 24 hours) at 07/20/12 1449 Last data filed at 07/20/12 1300  Gross per 24 hour  Intake    722 ml  Output      0 ml  Net    722 ml    Last BM: 4/3  Labs:   Recent Labs Lab 07/19/12 2245  NA 131*  K 3.7  CL 95*  CO2 26  BUN 18  CREATININE 0.81  CALCIUM 9.3  GLUCOSE 103*    CBG (last 3)  No results found for this basename: GLUCAP,  in the last 72 hours  Scheduled Meds: . antiseptic oral rinse  15 mL Mouth Rinse BID  . dexamethasone  8 mg Intravenous Q12H  . enoxaparin (LOVENOX) injection  40 mg Subcutaneous Q24H  . levofloxacin (LEVAQUIN) IV  500 mg Intravenous Q24H    Continuous Infusions: . 0.9 % NaCl with KCl 20 mEq / L 75 mL/hr at 07/20/12 0302    Past  Medical History  Diagnosis Date  . 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 Anemia 08/2011    Past Surgical History  Procedure Laterality Date  . Appendectomy    . Colon resection    . Knee arthroscopy Left   . Portacath placement Left 01/2012    left arm     Levon Hedger MS, RD, LDN [phone] Pager [phone] After Hours Pager

## 2012-07-20 NOTE — H&P (Signed)
[facility]                                                                                    Patient Demographics  Kristen Alexander, is a 67 y.o. female  CSN: [id number]  MRN: [id number]  DOB - 1946-02-08  Admit Date - 07/19/2012  Outpatient Primary MD for the patient is Thornton Papas, MD   With History of -  Past Medical History  Diagnosis Date  . 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 Appendectomy    . Colon resection    . Knee arthroscopy Left     in for   Chief Complaint  Patient presents with  . Weakness  . Nausea  . Emesis     HPI  Kristen Alexander  is a 67 y.o. female, with past medical history significant for colon cancer status post surgery in May 2013 of the diagnosis and multiple chemotherapy cycles last one was last week presenting today with multiple episodes of nausea and vomiting that started after her last chemotherapy. Patient reports abdominal pain , nonbilious vomiting mainly mucoid. Patient had surgery for small bowel obstruction in September of last year. Patient denies any burning on urination or any blood in the urine.    Review of Systems    In addition to the HPI above,  No Fever-chills, No Headache, No changes with Vision or hearing, No problems swallowing food or Liquids, No Chest pain, Cough or Shortness of Breath, No Blood in stool or Urine, No dysuria, No new skin rashes or bruises, No new joints pains-aches,  No new weakness, tingling, numbness in any extremity, No recent weight gain or loss, No polyuria, polydypsia or polyphagia, No significant Mental Stressors.  A full 10 point Review of Systems was done, except as stated above, all other Review of Systems were negative.   Social History History  Substance Use Topics  . Smoking status: Never Smoker   . Smokeless tobacco: Not on file  . Alcohol Use: No    Family History Significant for  hypertension in her mother  Prior to Admission medications   Medication Sig Start Date End Date Taking? Authorizing Provider  loperamide (IMODIUM) 2 MG capsule Take 1 capsule (2 mg total) by mouth 4 (four) times daily as needed for diarrhea or loose stools (or as directed for irinotecan diarrhea). 07/03/12  Yes Ladene Artist, MD  ondansetron (ZOFRAN-ODT) 8 MG disintegrating tablet Take 1 tablet (8 mg total) by mouth every 8 (eight) hours as needed for nausea. 06/20/12  Yes Ladene Artist, MD  Oxycodone HCl 10 MG TABS Take 1 tablet (10 mg total) by mouth every 4 (four) hours as needed (pain). 06/29/12  Yes Ladene Artist, MD  pegfilgrastim (NEULASTA) 6 MG/0.6ML injection Inject 6 mg into the skin every 14 (fourteen) days.   Yes Historical Provider, MD  potassium chloride SA (K-DUR,KLOR-CON) 20 MEQ tablet Take 1 tablet (20 mEq total) by mouth daily. Take one by mouth twice today (06/29/12), then one daily 06/29/12  Yes Ladene Artist, MD  PRESCRIPTION MEDICATION Receiving chemotherapy  at [facility] (Dr. Truett Perna).  Regimen is Avastin/FOLFIRI  every 14 days.  Last treatment was 07/12/12, next planned for 07/26/12.   Yes Historical Provider, MD    Allergies  Allergen Reactions  . Ativan (Lorazepam) Other (See Comments)    Hyperactivity   . Benadryl (Diphenhydramine Hcl) Other (See Comments)    Restless and hyperactive    Physical Exam  Vitals  Blood pressure 138/71, pulse 82, temperature 99.4 F (37.4 C), temperature source Oral, resp. rate 16, SpO2 100.00%.   1. General elderly white American female looks tired and dehydrated  2. Normal affect and insight, Not Suicidal or Homicidal, Awake Alert, Oriented X 3.  3. No F.N deficits, ALL C.Nerves Intact, Strength 5/5 all 4 extremities, Sensation intact all 4 extremities, Plantars down going.  4. Ears and Eyes appear Normal, Conjunctivae clear, PERRLA. Dry become mucosa.  5. Supple Neck, No JVD, No cervical lymphadenopathy appriciated, No Carotid  Bruits.  6. Symmetrical Chest wall movement, Good air movement bilaterally, CTAB.  7. RRR, No Gallops, Rubs or Murmurs, No Parasternal Heave.  8. Positive Bowel Sounds, Abdomen Soft, Non tender, No organomegaly appriciated, scars of old surgery is well-healed  9.  No Cyanosis, Normal Skin Turgor, No Skin Rash or Bruise.  10. Good muscle tone,  joints appear normal , no effusions, Normal ROM.  11. No Palpable Lymph Nodes in Neck or Axillae    Data Review  CBC  Recent Labs Lab 07/19/12 2245  WBC 6.6  HGB 10.9*  HCT 32.9*  PLT 341  MCV 80.8  MCH 26.8  MCHC 33.1  RDW 14.5  LYMPHSABS 1.9  MONOABS 1.0  EOSABS 0.1  BASOSABS 0.0   ------------------------------------------------------------------------------------------------------------------  Chemistries   Recent Labs Lab 07/19/12 2245  NA 131*  K 3.7  CL 95*  CO2 26  GLUCOSE 103*  BUN 18  CREATININE 0.81  CALCIUM 9.3  AST 12  ALT 8  ALKPHOS 125*  BILITOT 0.3   ------------------------------------------------------------------------------------------------------------------     ---------------------------------------------------------------------------------------------------------------  Urinalysis    Component Value Date/Time   COLORURINE YELLOW 07/20/2012 0005   APPEARANCEUR CLOUDY* 07/20/2012 0005   LABSPEC 1.036* 07/20/2012 0005   PHURINE 6.0 07/20/2012 0005   GLUCOSEU NEGATIVE 07/20/2012 0005   HGBUR NEGATIVE 07/20/2012 0005   BILIRUBINUR SMALL* 07/20/2012 0005   KETONESUR 15* 07/20/2012 0005   PROTEINUR 30* 07/20/2012 0005   UROBILINOGEN 0.2 07/20/2012 0005   NITRITE NEGATIVE 07/20/2012 0005   LEUKOCYTESUR TRACE* 07/20/2012 0005    ----------------------------------------------------------------------------------------------------------------     Imaging results:   Dg Abd Acute W/chest  07/19/2012  *RADIOLOGY REPORT*  Clinical Data: Nausea and vomiting.  History colon cancer.  ACUTE ABDOMEN SERIES  (ABDOMEN 2 VIEW & CHEST 1 VIEW)  Comparison: Chest 06/13/2012  Findings: Stable appearance of left PICC catheter. The heart size and pulmonary vascularity are normal. The lungs appear clear and expanded without focal air space disease or consolidation. No blunting of the costophrenic angles.  No pneumothorax.  Mediastinal contours appear intact.  Surgical clips in the mid and lower abdomen.  Scattered gas in the colon and small bowel without small or large bowel distension.  No free intra-abdominal air.  No abnormal air fluid levels.  No radiopaque stones demonstrated.  Visualized bones appear intact.  IMPRESSION: No evidence of active pulmonary disease.  Nonobstructive bowel gas pattern.   Original Report Authenticated By: Burman Nieves, M.D.     Assessment & Plan  1. intractable nausea vomiting 2. Dehydration 3. UTI 4. colon cancer ,  status post chemotherapy and surgery x2. Last chemotherapy was last week 5. anemia, multifactorial 6. Abdominal pain with negative abdominal x-ray  Plan  IV fluids Pain control  Zofran Check CT of the abdomen in a.m. after hydration Consult oncology in a.m. Start Levaquin   DVT Prophylaxis Lovenox  AM Labs Ordered, also please review Full Orders  Family Communication: Admission, patients condition and plan of care including tests being ordered have been discussed with the patient  who indicate understanding and agree with the plan and Code Status.  Code Status full  Disposition Plan: Home  Time spent in minutes : 40 minutes home  Condition GUARDED

## 2012-07-20 NOTE — Progress Notes (Signed)
IP PROGRESS NOTE  Subjective:   She is known to me with a history of metastatic cecum/appendix carcinoma, currently being treated with FOLFIRI/Avastin. She was last treated with chemotherapy on 07/12/2012. She reports developing nausea beginning on day 3 following chemotherapy. The nausea progressed over the past several days. She was unable to hold down fluids yesterday and presented to the emergency room.  She continues to have nausea. She is having bowel movements. No bleeding or symptoms of thrombosis.  Objective: Vital signs in last 24 hours: Blood pressure 118/69, pulse 85, temperature 98 F (36.7 C), temperature source Oral, resp. rate 20, height 5\' 3"  (1.6 m), weight 137 lb 4.8 oz (62.279 kg), SpO2 100.00%.  Intake/Output from previous day: 04/02 0701 - 04/03 0700 In: 362 [I.V.:362] Out: -   Physical Exam:  HEENT: No thrush Lungs: Clear bilaterally Cardiac: Regular rate and rhythm Abdomen: Soft, nontender, no mass, no hepatomegaly, bowel sounds are present Extremities: No leg edema   Portacath/PICC-without erythema  Lab Results:  Recent Labs  07/19/12 2245  WBC 6.6  HGB 10.9*  HCT 32.9*  PLT 341   ANC 3.6  BMET  Recent Labs  07/19/12 2245  NA 131*  K 3.7  CL 95*  CO2 26  GLUCOSE 103*  BUN 18  CREATININE 0.81  CALCIUM 9.3    Studies/Results: Dg Abd Acute W/chest  07/19/2012  *RADIOLOGY REPORT*  Clinical Data: Nausea and vomiting.  History colon cancer.  ACUTE ABDOMEN SERIES (ABDOMEN 2 VIEW & CHEST 1 VIEW)  Comparison: Chest 06/13/2012  Findings: Stable appearance of left PICC catheter. The heart size and pulmonary vascularity are normal. The lungs appear clear and expanded without focal air space disease or consolidation. No blunting of the costophrenic angles.  No pneumothorax.  Mediastinal contours appear intact.  Surgical clips in the mid and lower abdomen.  Scattered gas in the colon and small bowel without small or large bowel distension.  No free  intra-abdominal air.  No abnormal air fluid levels.  No radiopaque stones demonstrated.  Visualized bones appear intact.  IMPRESSION: No evidence of active pulmonary disease.  Nonobstructive bowel gas pattern.   Original Report Authenticated By: Burman Nieves, M.D.     Medications: I have reviewed the patient's current medications.  Assessment/Plan:  1. 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 Lenis Noon  2. Nausea/vomiting-likely related to irinotecan versus abdominal carcinomatosis   The nausea and vomiting are most likely related to chemotherapy. A plain x-ray reveals no evidence of obstruction and she is having bowel movements. It is possible her symptoms are related to carcinomatosis.  Recommendations: 1. Continue intravenous hydration and anti-emetics. 2. Add Decadron to treat delayed chemotherapy-induced nausea 3. Advanced diet as tolerated.  I will continue to follow her in the hospital. Outpatient followup is scheduled for the week of 07/24/2012. I appreciate the care from Dr. Darnelle Catalan.   LOS: 1 day   Kayli Beal  07/20/2012, 11:02 AM

## 2012-07-20 NOTE — Progress Notes (Signed)
[facility] PROGRESS NOTE  Kristen Alexander ZOX:[id number] DOB: Oct 07, 1945 DOA: 07/19/2012 PCP: Thornton Papas, MD  Brief narrative: Kristen Alexander is an 67 y.o. 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 Acute abdominal films were negative for recurrent obstruction. Her last chemotherapy was given on 07/12/2012.  Assessment/Plan: Principal Problem:   Intractable nausea and vomiting likely from chemotherapy-induced enteritis -Acute abdominal series done on admission was negative for bowel obstruction. -Continue IV fluids and antinausea medications as needed. -Lipase normal at 11. -Will cancel CT of abdomen and pelvis as the most likely explanation for her symptoms is chemotherapy-induced enteritis. Active Problems:   Dehydration -Continue IV fluids.   Fever / ? UTI -Urinalysis negative for nitrites. Empirically on Levaquin. Followup urine cultures. -Lactic acid level normal at 1.3.   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 Mild.   Normocytic anemia -Multifactorial with anemia of chronic disease and the sequela of prior chemotherapy all contributory. -No current indication for blood transfusion.   Code Status: Full. Family Communication: Updated at bedside. Disposition Plan: Home when stable.   Medical Consultants:  Dr. Mancel Bale, oncology  Other Consultants:  None.  Anti-infectives:  Levaquin 07/19/2012--->  HPI/Subjective: Kristen Alexander is feeling a  little bit better. Continues to have significant nausea but no active vomiting. Appetite poor. No abdominal pain but some discomfort.  Objective: Filed Vitals:   07/19/12 2332 07/20/12 0048 07/20/12 0210 07/20/12 0653  BP: 138/71 136/74 136/68 118/69  Pulse: 82 89 79 85  Temp: 99.4 F (37.4 C)  98.6 F (37 C) 98 F (36.7 C)  TempSrc: Oral  Oral Oral  Resp:   20 20  Height:   5\' 3"  (1.6 m)   Weight:   62.279 kg (137 lb 4.8 oz)   SpO2: 100% 95% 100% 100%    Intake/Output Summary (Last 24 hours) at 07/20/12 0827 Last data filed at 07/20/12 8119  Gross per 24 hour  Intake    362 ml  Output      0 ml  Net    362 ml    Exam: Gen:  NAD Cardiovascular:  RRR, No M/R/G Respiratory:  Lungs CTAB Gastrointestinal:  Abdomen soft, NT/ND, hyperactive bowel sounds Extremities:  No C/E/C  Data Reviewed: Basic Metabolic Panel:  Recent Labs Lab 07/19/12 2245  NA 131*  K 3.7  CL 95*  CO2 26  GLUCOSE 103*  BUN 18  CREATININE 0.81  CALCIUM 9.3   GFR Estimated Creatinine Clearance: 57.3 ml/min (by C-G formula based on Cr of 0.81). Liver Function Tests:  Recent Labs Lab 07/19/12 2245  AST 12  ALT 8  ALKPHOS 125*  BILITOT 0.3  PROT 6.6  ALBUMIN 3.3*    Recent Labs Lab 07/19/12 2245  LIPASE 11   CBC:  Recent Labs Lab 07/19/12 2245  WBC 6.6  NEUTROABS 3.6  HGB 10.9*  HCT 32.9*  MCV 80.8  PLT 341   Urinalysis    Component Value Date/Time   COLORURINE YELLOW 07/20/2012 0005   APPEARANCEUR CLOUDY* 07/20/2012 0005   LABSPEC 1.036* 07/20/2012 0005   PHURINE 6.0 07/20/2012  0005   GLUCOSEU NEGATIVE 07/20/2012 0005   HGBUR NEGATIVE 07/20/2012 0005   BILIRUBINUR SMALL* 07/20/2012 0005   KETONESUR 15* 07/20/2012 0005   PROTEINUR 30* 07/20/2012 0005   UROBILINOGEN 0.2 07/20/2012 0005   NITRITE NEGATIVE 07/20/2012 0005   LEUKOCYTESUR TRACE* 07/20/2012 0005     Microbiology No results found for this or any previous visit (from the past 240 hour(s)).   Procedures and Diagnostic  Studies: Dg Abd Acute W/chest  07/19/2012  *RADIOLOGY REPORT*  Clinical Data: Nausea and vomiting.  History colon cancer.  ACUTE ABDOMEN SERIES (ABDOMEN 2 VIEW & CHEST 1 VIEW)  Comparison: Chest 06/13/2012  Findings: Stable appearance of left PICC catheter. The heart size and pulmonary vascularity are normal. The lungs appear clear and expanded without focal air space disease or consolidation. No blunting of the costophrenic angles.  No pneumothorax.  Mediastinal contours appear intact.  Surgical clips in the mid and lower abdomen.  Scattered gas in the colon and small bowel without small or large bowel distension.  No free intra-abdominal air.  No abnormal air fluid levels.  No radiopaque stones demonstrated.  Visualized bones appear intact.  IMPRESSION: No evidence of active pulmonary disease.  Nonobstructive bowel gas pattern.   Original Report Authenticated By: Burman Nieves, M.D.     Scheduled Meds: . antiseptic oral rinse  15 mL Mouth Rinse BID  . enoxaparin (LOVENOX) injection  40 mg Subcutaneous Q24H  . levofloxacin (LEVAQUIN) IV  500 mg Intravenous Q24H   Continuous Infusions: . 0.9 % NaCl with KCl 20 mEq / L 75 mL/hr at 07/20/12 0302    Time spent: 35 minutes.   LOS: 1 day   RAMA,CHRISTINA  [facility] Pager [phone].  If 8PM-8AM, please contact night-coverage at [email or web address], password [facility] 07/20/2012, 8:27 AM

## 2012-07-21 DIAGNOSIS — C800 Disseminated malignant neoplasm, unspecified: Secondary | ICD-10-CM

## 2012-07-21 DIAGNOSIS — E860 Dehydration: Secondary | ICD-10-CM

## 2012-07-21 DIAGNOSIS — E871 Hypo-osmolality and hyponatremia: Secondary | ICD-10-CM

## 2012-07-21 LAB — BASIC METABOLIC PANEL
CO2: 24 mEq/L (ref 19–32)
Calcium: 9.4 mg/dL (ref 8.4–10.5)
Chloride: 98 mEq/L (ref 96–112)
Glucose, Bld: 131 mg/dL — ABNORMAL HIGH (ref 70–99)
Potassium: 4.4 mEq/L (ref 3.5–5.1)
Sodium: 131 mEq/L — ABNORMAL LOW (ref 135–145)

## 2012-07-21 LAB — URINE CULTURE: Culture: NO GROWTH

## 2012-07-21 MED ORDER — ADULT MULTIVITAMIN W/MINERALS CH: 1.0000 | ORAL_TABLET | Freq: Every day | ORAL | Status: DC

## 2012-07-21 MED ORDER — HEPARIN SOD (PORK) LOCK FLUSH 100 UNIT/ML IV SOLN
INTRAVENOUS | Status: AC
  Administered 2012-07-21: 500 [IU]
  Filled 2012-07-21: qty 5

## 2012-07-21 MED ORDER — LOPERAMIDE HCL 2 MG PO CAPS: 2.0000 mg | ORAL_CAPSULE | Freq: Four times a day (QID) | ORAL | Status: DC | PRN

## 2012-07-21 MED ORDER — OXYCODONE HCL 10 MG PO TABS: 10.0000 mg | ORAL_TABLET | ORAL | Status: DC | PRN

## 2012-07-21 MED ORDER — ONDANSETRON HCL 4 MG PO TABS: 4.0000 mg | ORAL_TABLET | Freq: Four times a day (QID) | ORAL | Status: DC | PRN

## 2012-07-21 MED ORDER — ALPRAZOLAM 0.25 MG PO TABS: 0.2500 mg | ORAL_TABLET | Freq: Three times a day (TID) | ORAL | Status: DC | PRN

## 2012-07-21 MED ORDER — HYDROCODONE-ACETAMINOPHEN 5-325 MG PO TABS: 1.0000 | ORAL_TABLET | ORAL | Status: DC | PRN

## 2012-07-21 MED ORDER — PROCHLORPERAZINE MALEATE 10 MG PO TABS: 10.0000 mg | ORAL_TABLET | Freq: Four times a day (QID) | ORAL | Status: DC | PRN

## 2012-07-21 MED ORDER — ZOLPIDEM TARTRATE 5 MG PO TABS: 5.0000 mg | ORAL_TABLET | Freq: Every evening | ORAL | Status: DC | PRN

## 2012-07-21 NOTE — Progress Notes (Signed)
IP PROGRESS NOTE  Subjective:   She feels better. One episode of emesis yesterday. She is having bowel movements. No nausea this morning.  Objective: Vital signs in last 24 hours: Blood pressure 132/65, pulse 74, temperature 97.9 F (36.6 C), temperature source Oral, resp. rate 18, height 5\' 3"  (1.6 m), weight 137 lb 4.8 oz (62.279 kg), SpO2 98.00%.  Intake/Output from previous day: 04/03 0701 - 04/04 0700 In: 2547.5 [P.O.:600; I.V.:1747.5; IV Piggyback:200] Out: -   Physical Exam:  HEENT: No thrush Lungs: Clear bilaterally Cardiac: Regular rate and rhythm Abdomen: Soft, nontender, no mass, no hepatomegaly, bowel sounds are present Extremities: No leg edema  Lab Results:  Recent Labs  07/19/12 2245  WBC 6.6  HGB 10.9*  HCT 32.9*  PLT 341   ANC 3.6  BMET  Recent Labs  07/19/12 2245 07/21/12 0530  NA 131* 131*  K 3.7 4.4  CL 95* 98  CO2 26 24  GLUCOSE 103* 131*  BUN 18 10  CREATININE 0.81 0.63  CALCIUM 9.3 9.4    Studies/Results: Dg Abd Acute W/chest  07/19/2012  *RADIOLOGY REPORT*  Clinical Data: Nausea and vomiting.  History colon cancer.  ACUTE ABDOMEN SERIES (ABDOMEN 2 VIEW & CHEST 1 VIEW)  Comparison: Chest 06/13/2012  Findings: Stable appearance of left PICC catheter. The heart size and pulmonary vascularity are normal. The lungs appear clear and expanded without focal air space disease or consolidation. No blunting of the costophrenic angles.  No pneumothorax.  Mediastinal contours appear intact.  Surgical clips in the mid and lower abdomen.  Scattered gas in the colon and small bowel without small or large bowel distension.  No free intra-abdominal air.  No abnormal air fluid levels.  No radiopaque stones demonstrated.  Visualized bones appear intact.  IMPRESSION: No evidence of active pulmonary disease.  Nonobstructive bowel gas pattern.   Original Report Authenticated By: Burman Nieves, M.D.     Medications: I have reviewed the patient's current  medications.  Assessment/Plan:  1. 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 Lenis Noon  2. Nausea/vomiting-likely related to irinotecan versus abdominal carcinomatosis, improved  The nausea/vomiting was most likely related to irinotecan. She appears improved.  Recommendations: 1. advanced diet as tolerated 2. Okay for discharge from an oncology standpoint if she is tolerating a diet today 3. I will discontinue Decadron after the a.m. dose today 4. A followup appointment is scheduled for 07/26/2012 at the cancer Center.     LOS: 2 days   Kadan Millstein  07/21/2012, 9:05 AM

## 2012-07-21 NOTE — Progress Notes (Signed)
Pt ready for d/c. This RN went over d/c instructions with pt, instructed on new medications. No change in pt condition since AM assessment. Port flushed with 10 ml of NS, 5 ml of heparin, and then deaccessed and covered with a bandaid. Pt d/c'd to home with friend. Eugene Garnet RN

## 2012-07-21 NOTE — Progress Notes (Signed)
MD, could patient get something in addition to Zofran for nausea, maybe compazine or phenergan? Zofran helped but patient developed nausea in between doses, since it could only be given q 6 hours.Kristen Alexander

## 2012-07-21 NOTE — Discharge Summary (Signed)
Physician Discharge Summary  Kristen Alexander WJX:[id number] DOB: Aug 30, 1945 DOA: 07/19/2012  PCP: Thornton Papas, MD  Admit date: 07/19/2012 Discharge date: 07/21/2012  Recommendations for Outpatient Follow-up:  1. Follow up with PCP in 1-2 weeks post discharge or sooner if symptoms persist 2. 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 Acute abdominal films were negative for recurrent obstruction. Her last chemotherapy was given on 07/12/2012.  Patient is doing well and has tolerated 100% of her meal with no subsequent nausea or vomiting.  Assessment/Plan:  Principal Problem:  Intractable nausea and vomiting likely from chemotherapy-induced enteritis   Acute abdominal series done on admission was negative for bowel obstruction. Lipase level on admission was WNL  Tolerated regular diet well   Active Problems:  Dehydration   Resolved with IV fluids Fever / Possible UTI   Urinalysis and urine culture negative. 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 Mild.  Normocytic anemia   Multifactorial with anemia of chronic disease and the sequela of prior chemotherapy all contributory.   Hemoglobin stable and no signs of active bleed.   Code Status: Full.  Family Communication: Updated at bedside.  Disposition Plan: Home today   Medical Consultants:  Dr. Mancel Bale, oncology Other Consultants:  None. Anti-infectives:  Levaquin 07/19/2012--->07/21/2012   Discharge Exam: Filed Vitals:   07/21/12 0557  BP: 132/65  Pulse: 74  Temp: 97.9 F (36.6 C)  Resp: 18   Filed Vitals:   07/20/12 0653 07/20/12 1332 07/20/12 2100 07/21/12 0557  BP: 118/69 125/68 136/70 132/65  Pulse: 85 78 73 74  Temp: 98 F (36.7 C) 98 F (36.7 C) 98.4 F (36.9 C) 97.9 F (36.6 C)  TempSrc: Oral  Oral Oral  Resp: 20 18 20 18   Height:      Weight:      SpO2: 100% 100% 100% 98%    General: Pt is alert, follows commands appropriately, not in acute distress Cardiovascular: Regular rate and rhythm, S1/S2 +, no murmurs, no rubs, no gallops Respiratory: Clear to auscultation bilaterally, no wheezing, no crackles, no rhonchi Abdominal: Soft, non tender, non distended, bowel sounds +, no guarding Extremities: no edema, no cyanosis, pulses palpable bilaterally DP and PT Neuro: Grossly nonfocal  Discharge Instructions  Discharge Orders   Future Appointments Provider Department Dept Phone   07/26/2012 8:30 AM Krista Blue [facility] MEDICAL ONCOLOGY [phone]   07/26/2012 9:00 AM Ladene Artist, MD [facility] MEDICAL ONCOLOGY [phone]   07/26/2012 10:15 AM [facility] MED[facility] ONCOL[facility] [phone]   07/28/2012 12:00 PM [facility] Nurse [facility] MEDICAL ONCOLOGY [phone]   08/09/2012 8:45 AM Dava Najjar [facility] [phone]    08/09/2012 9:15 AM Rana Snare, NP [facility] MEDICAL ONCOLOGY [phone]   08/09/2012 10:15 AM [facility] [address] [address] [facility] MEDICAL ONCOLOGY [phone]   08/11/2012 10:00 AM [facility] Flush Nurse [facility] MEDICAL ONCOLOGY [phone]   Future Orders Complete By Expires     Call MD for:  difficulty breathing, headache or visual disturbances  As directed     Call MD for:  persistant dizziness or light-headedness  As directed     Call MD for:  persistant nausea and vomiting  As directed     Call MD for:  severe uncontrolled pain  As directed     Diet - low sodium heart healthy  As directed     Increase activity slowly  As directed         Medication List    TAKE these medications       ALPRAZolam 0.25 MG tablet  Commonly known as:  XANAX  Take 1 tablet (0.25 mg total) by mouth 3 (three) times daily as needed for sleep or anxiety.     HYDROcodone-acetaminophen 5-325 MG per tablet  Commonly known as:  NORCO/VICODIN  Take 1-2 tablets by mouth every 4 (four) hours as needed.     loperamide 2 MG capsule  Commonly known as:  IMODIUM  Take 1 capsule (2 mg total) by mouth 4 (four) times daily as needed for diarrhea or loose stools (or as directed for irinotecan diarrhea).     multivitamin with minerals Tabs  Take 1 tablet by mouth daily.     NEULASTA 6 MG/0.6ML injection  Generic drug:  pegfilgrastim  Inject 6 mg into the skin every 14 (fourteen) days.     ondansetron 8 MG disintegrating tablet  Commonly known as:  ZOFRAN-ODT  Take 1 tablet (8 mg total) by mouth every 8 (eight) hours as needed for nausea.     Oxycodone HCl 10 MG Tabs  Take 1 tablet (10 mg total) by mouth every 4 (four) hours as needed (pain).     potassium chloride SA 20 MEQ tablet  Commonly known as:  K-DUR,KLOR-CON  Take 1 tablet (20 mEq total) by mouth daily. Take one by mouth twice today (06/29/12), then one daily     PRESCRIPTION MEDICATION  Receiving  chemotherapy at [facility] (Dr. Truett Perna).  Regimen is Avastin/FOLFIRI  every 14 days.  Last treatment was 07/12/12, next planned for 07/26/12.     prochlorperazine 10 MG tablet  Commonly known as:  COMPAZINE  Take 1 tablet (10 mg total) by mouth every 6 (six) hours as needed.     zolpidem 5 MG tablet  Commonly known as:  AMBIEN  Take 1 tablet (5 mg total) by mouth at bedtime as needed for sleep (insomnia).           Follow-up Information   Schedule an appointment as soon as possible for a visit with Thornton Papas, MD.   Contact information:   [address] [place] [address] [phone]        The results of significant diagnostics from this hospitalization (including imaging, microbiology, ancillary and laboratory) are listed below for reference.    Significant Diagnostic Studies: Dg Abd Acute W/chest  07/19/2012  *RADIOLOGY REPORT*  Clinical Data: Nausea and vomiting.  History colon cancer.  ACUTE ABDOMEN SERIES (ABDOMEN  2 VIEW & CHEST 1 VIEW)  Comparison: Chest 06/13/2012  Findings: Stable appearance of left PICC catheter. The heart size and pulmonary vascularity are normal. The lungs appear clear and expanded without focal air space disease or consolidation. No blunting of the costophrenic angles.  No pneumothorax.  Mediastinal contours appear intact.  Surgical clips in the mid and lower abdomen.  Scattered gas in the colon and small bowel without small or large bowel distension.  No free intra-abdominal air.  No abnormal air fluid levels.  No radiopaque stones demonstrated.  Visualized bones appear intact.  IMPRESSION: No evidence of active pulmonary disease.  Nonobstructive bowel gas pattern.   Original Report Authenticated By: Burman Nieves, M.D.     Microbiology: Recent Results (from the past 240 hour(s))  URINE CULTURE     Status: None   Collection Time    07/20/12 12:05 AM      Result Value Range Status   Specimen Description URINE, RANDOM   Final   Special  Requests NONE   Final   Culture  Setup Time 07/20/2012 05:46   Final   Colony Count NO GROWTH   Final   Culture NO GROWTH   Final   Report Status 07/21/2012 FINAL   Final  CULTURE, BLOOD (ROUTINE X 2)     Status: None   Collection Time    07/20/12 12:22 AM      Result Value Range Status   Specimen Description BLOOD RIGHT ANTECUBITAL   Final   Special Requests BOTTLES DRAWN AEROBIC AND ANAEROBIC 5CC   Final   Culture  Setup Time 07/20/2012 05:48   Final   Culture     Final   Value:        BLOOD CULTURE RECEIVED NO GROWTH TO DATE CULTURE WILL BE HELD FOR 5 DAYS BEFORE ISSUING A FINAL NEGATIVE REPORT   Report Status PENDING   Incomplete  CULTURE, BLOOD (ROUTINE X 2)     Status: None   Collection Time    07/20/12 12:22 AM      Result Value Range Status   Specimen Description BLOOD RIGHT HAND   Final   Special Requests BOTTLES DRAWN AEROBIC AND ANAEROBIC 3CC   Final   Culture  Setup Time 07/20/2012 05:48   Final   Culture     Final   Value:        BLOOD CULTURE RECEIVED NO GROWTH TO DATE CULTURE WILL BE HELD FOR 5 DAYS BEFORE ISSUING A FINAL NEGATIVE REPORT   Report Status PENDING   Incomplete     Labs: Basic Metabolic Panel:  Recent Labs Lab 07/19/12 2245 07/21/12 0530  NA 131* 131*  K 3.7 4.4  CL 95* 98  CO2 26 24  GLUCOSE 103* 131*  BUN 18 10  CREATININE 0.81 0.63  CALCIUM 9.3 9.4   Liver Function Tests:  Recent Labs Lab 07/19/12 2245  AST 12  ALT 8  ALKPHOS 125*  BILITOT 0.3  PROT 6.6  ALBUMIN 3.3*    Recent Labs Lab 07/19/12 2245  LIPASE 11   No results found for this basename: AMMONIA,  in the last 168 hours CBC:  Recent Labs Lab 07/19/12 2245  WBC 6.6  NEUTROABS 3.6  HGB 10.9*  HCT 32.9*  MCV 80.8  PLT 341   Cardiac Enzymes: No results found for this basename: CKTOTAL, CKMB, CKMBINDEX, TROPONINI,  in the last 168 hours BNP: BNP (last 3 results) No results found for this basename: PROBNP,  in the  last 8760 hours CBG: No results found  for this basename: GLUCAP,  in the last 168 hours  Time coordinating discharge: Over 30 minutes  Signed:  Manson Passey, MD  [facility]  07/21/2012, 10:12 AM  Pager #: [phone]

## 2012-07-23 ENCOUNTER — Other Ambulatory Visit: Payer: Self-pay | Admitting: Oncology

## 2012-07-26 ENCOUNTER — Other Ambulatory Visit (HOSPITAL_BASED_OUTPATIENT_CLINIC_OR_DEPARTMENT_OTHER): Payer: Medicare Other | Admitting: Lab

## 2012-07-26 ENCOUNTER — Ambulatory Visit (HOSPITAL_BASED_OUTPATIENT_CLINIC_OR_DEPARTMENT_OTHER): Payer: Medicare Other | Admitting: Oncology

## 2012-07-26 ENCOUNTER — Telehealth: Payer: Self-pay | Admitting: Oncology

## 2012-07-26 ENCOUNTER — Ambulatory Visit (HOSPITAL_BASED_OUTPATIENT_CLINIC_OR_DEPARTMENT_OTHER): Payer: Medicare Other

## 2012-07-26 VITALS — BP 110/75 | HR 114 | Temp 96.4°F | Resp 18 | Ht 63.0 in | Wt 140.7 lb

## 2012-07-26 DIAGNOSIS — C189 Malignant neoplasm of colon, unspecified: Secondary | ICD-10-CM

## 2012-07-26 DIAGNOSIS — C50919 Malignant neoplasm of unspecified site of unspecified female breast: Secondary | ICD-10-CM

## 2012-07-26 DIAGNOSIS — Z5111 Encounter for antineoplastic chemotherapy: Secondary | ICD-10-CM

## 2012-07-26 DIAGNOSIS — C779 Secondary and unspecified malignant neoplasm of lymph node, unspecified: Secondary | ICD-10-CM

## 2012-07-26 DIAGNOSIS — Z5112 Encounter for antineoplastic immunotherapy: Secondary | ICD-10-CM

## 2012-07-26 DIAGNOSIS — R109 Unspecified abdominal pain: Secondary | ICD-10-CM

## 2012-07-26 LAB — CULTURE, BLOOD (ROUTINE X 2): Culture: NO GROWTH

## 2012-07-26 LAB — CBC WITH DIFFERENTIAL/PLATELET
Basophils Absolute: 0.1 10*3/uL (ref 0.0–0.1)
Eosinophils Absolute: 0.1 10*3/uL (ref 0.0–0.5)
HCT: 36 % (ref 34.8–46.6)
HGB: 11.4 g/dL — ABNORMAL LOW (ref 11.6–15.9)
LYMPH%: 23.8 % (ref 14.0–49.7)
MCV: 82.9 fL (ref 79.5–101.0)
MONO#: 1.2 10*3/uL — ABNORMAL HIGH (ref 0.1–0.9)
MONO%: 9.4 % (ref 0.0–14.0)
NEUT#: 8.2 10*3/uL — ABNORMAL HIGH (ref 1.5–6.5)
NEUT%: 65.4 % (ref 38.4–76.8)
Platelets: 370 10*3/uL (ref 145–400)
WBC: 12.5 10*3/uL — ABNORMAL HIGH (ref 3.9–10.3)
nRBC: 1 % — ABNORMAL HIGH (ref 0–0)

## 2012-07-26 LAB — COMPREHENSIVE METABOLIC PANEL (CC13)
ALT: 10 U/L (ref 0–55)
AST: 11 U/L (ref 5–34)
Alkaline Phosphatase: 117 U/L (ref 40–150)
BUN: 17.5 mg/dL (ref 7.0–26.0)
Creatinine: 0.8 mg/dL (ref 0.6–1.1)
Total Bilirubin: 0.21 mg/dL (ref 0.20–1.20)

## 2012-07-26 MED ORDER — DEXAMETHASONE SODIUM PHOSPHATE 4 MG/ML IJ SOLN
12.0000 mg | Freq: Once | INTRAMUSCULAR | Status: AC
  Administered 2012-07-26: 12 mg via INTRAVENOUS

## 2012-07-26 MED ORDER — IRINOTECAN HCL CHEMO INJECTION 100 MG/5ML
180.0000 mg/m2 | Freq: Once | INTRAVENOUS | Status: AC
  Administered 2012-07-26: 322 mg via INTRAVENOUS
  Filled 2012-07-26: qty 16.1

## 2012-07-26 MED ORDER — SODIUM CHLORIDE 0.9 % IV SOLN
150.0000 mg | Freq: Once | INTRAVENOUS | Status: AC
  Administered 2012-07-26: 150 mg via INTRAVENOUS
  Filled 2012-07-26: qty 5

## 2012-07-26 MED ORDER — SODIUM CHLORIDE 0.9 % IV SOLN
Freq: Once | INTRAVENOUS | Status: AC
  Administered 2012-07-26: 11:00:00 via INTRAVENOUS

## 2012-07-26 MED ORDER — OXYCODONE HCL ER 15 MG PO T12A: 15.0000 mg | EXTENDED_RELEASE_TABLET | Freq: Two times a day (BID) | ORAL | Status: DC

## 2012-07-26 MED ORDER — ATROPINE SULFATE 0.4 MG/ML IJ SOLN
0.5000 mg | Freq: Once | INTRAMUSCULAR | Status: AC | PRN
  Administered 2012-07-26: 0.5 mg via INTRAVENOUS
  Filled 2012-07-26: qty 1.25

## 2012-07-26 MED ORDER — PALONOSETRON HCL INJECTION 0.25 MG/5ML
0.2500 mg | Freq: Once | INTRAVENOUS | Status: AC
  Administered 2012-07-26: 0.25 mg via INTRAVENOUS

## 2012-07-26 MED ORDER — SODIUM CHLORIDE 0.9 % IV SOLN
5.0000 mg/kg | Freq: Once | INTRAVENOUS | Status: AC
  Administered 2012-07-26: 350 mg via INTRAVENOUS
  Filled 2012-07-26: qty 14

## 2012-07-26 MED ORDER — ATROPINE SULFATE 0.4 MG/ML IJ SOLN
0.2000 mg | INTRAMUSCULAR | Status: AC
  Administered 2012-07-26: 0.2 mg via INTRAVENOUS
  Filled 2012-07-26: qty 0.5

## 2012-07-26 MED ORDER — LEUCOVORIN CALCIUM INJECTION 350 MG
400.0000 mg/m2 | Freq: Once | INTRAVENOUS | Status: AC
  Administered 2012-07-26: 716 mg via INTRAVENOUS
  Filled 2012-07-26: qty 35.8

## 2012-07-26 MED ORDER — SODIUM CHLORIDE 0.9 % IV SOLN
2400.0000 mg/m2 | INTRAVENOUS | Status: DC
  Administered 2012-07-26: 4300 mg via INTRAVENOUS
  Filled 2012-07-26: qty 86

## 2012-07-26 MED ORDER — FLUOROURACIL CHEMO INJECTION 2.5 GM/50ML
400.0000 mg/m2 | Freq: Once | INTRAVENOUS | Status: AC
  Administered 2012-07-26: 700 mg via INTRAVENOUS
  Filled 2012-07-26: qty 14

## 2012-07-26 NOTE — Telephone Encounter (Signed)
, °

## 2012-07-26 NOTE — Patient Instructions (Addendum)
[facility] Discharge Instructions for Patients Receiving Chemotherapy  Today you received the following chemotherapy agents :  Avastin,  Camptosar, Leucovorin, 5FU.  To help prevent nausea and vomiting after your treatment, we encourage you to take your nausea medication as instructed by Lenon Curt, pharmacist.    If you develop nausea and vomiting that is not controlled by your nausea medication, call the clinic. If it is after clinic hours your family physician or the after hours number for the clinic or go to the Emergency Department.   BELOW ARE SYMPTOMS THAT SHOULD BE REPORTED IMMEDIATELY:  *FEVER GREATER THAN 100.5 F  *CHILLS WITH OR WITHOUT FEVER  NAUSEA AND VOMITING THAT IS NOT CONTROLLED WITH YOUR NAUSEA MEDICATION  *UNUSUAL SHORTNESS OF BREATH  *UNUSUAL BRUISING OR BLEEDING  TENDERNESS IN MOUTH AND THROAT WITH OR WITHOUT PRESENCE OF ULCERS  *URINARY PROBLEMS  *BOWEL PROBLEMS  UNUSUAL RASH Items with * indicate a potential emergency and should be followed up as soon as possible.  One of the nurses will contact you 24 hours after your treatment. Please let the nurse know about any problems that you may have experienced. Feel free to call the clinic you have any questions or concerns. The clinic phone number is [phone].   I have been informed and understand all the instructions given to me. I know to contact the clinic, my physician, or go to the Emergency Department if any problems should occur. I do not have any questions at this time, but understand that I may call the clinic during office hours   should I have any questions or need assistance in obtaining follow up care.    __________________________________________  _____________  __________ Signature of Patient or Authorized Representative            Date                   Time    __________________________________________ Nurse's Signature

## 2012-07-26 NOTE — Progress Notes (Signed)
   [facility]    OFFICE PROGRESS NOTE   INTERVAL HISTORY:   She completed another cycle of FOLFIRI/Avastin on 07/12/2012. She developed nausea and vomiting following chemotherapy and was admitted on 07/19/2012. Her symptoms improved over several days in the hospital. The nausea and vomiting was attributed to chemotherapy. A plain x-ray on 07/19/2012 revealed a nonobstructive bowel gas pattern.  She reports no further vomiting since discharge from the hospital. She has intermittent "cramping "abdominal pain that is relieved with oxycodone. She is now taking 10 mg of oxycodone every 4 hours. No diarrhea.  Objective:  Vital signs in last 24 hours:  Blood pressure 110/75, pulse 114, temperature 96.4 F (35.8 C), temperature source Oral, resp. rate 18, height 5\' 3"  (1.6 m), weight 140 lb 11.2 oz (63.821 kg).    HEENT: No thrush or ulcers Resp: Lungs clear bilaterally Cardio: Regular rate and rhythm GI: No hepatomegaly, nontender, no mass Vascular: No leg edema  Portacath/PICC-without erythema  Lab Results:  Lab Results  Component Value Date   WBC 12.5* 07/26/2012   HGB 11.4* 07/26/2012   HCT 36.0 07/26/2012   MCV 82.9 07/26/2012   PLT 370 07/26/2012   ANC 8.2   Medications: I have reviewed the patient's current medications.  Assessment/Plan: 1. 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 Status post FOLFOX (3 cycles) with progressive nausea and weight loss. Chemotherapy switched to FOLFIRI (2 cycles). Admission with a bowel obstruction December 2013 status post jejuno-colic anastomosis with resolution of obstructive symptoms, progressive carcinomatosis noted at the time of surgery. Status post FOLFIRI/Avastin cycle 1 on 06/14/2012. 2. History of a microcytic anemia. Likely iron  deficiency. 3. Weight loss secondary to progressive metastatic colon cancer with a bowel obstruction. 4. Left arm passport. 5. Crampy abdominal pain during the irinotecan infusion cycle 2 relieved with atropine. 6. Diarrhea following cycle 2 FOLFIRI/Avastin. The diarrhea was controlled with Imodium. 7. Admission 07/19/2012 with intractable nausea/vomiting 8. Abdominal pain-? Secondary to direct tumor pain versus partial obstruction   Disposition:  She appears stable since discharge from the hospital. The plan is to proceed with another cycle of FOLFIRI/Avastin today. She will return for an office visit and chemotherapy in 2 weeks. She has been taking frequent oxycodone for pain. We added OxyContin today.   Thornton Papas, MD  07/26/2012  10:11 AM

## 2012-07-27 ENCOUNTER — Encounter: Payer: Self-pay | Admitting: Oncology

## 2012-07-27 ENCOUNTER — Encounter: Payer: Self-pay | Admitting: *Deleted

## 2012-07-27 NOTE — Progress Notes (Signed)
RECEIVED A FAX FROM [facility] CONCERNING A PRIOR AUTHORIZATION FOR OXYCONTIN. THIS REQUEST WAS PLACED IN THE MANAGED CARE BIN. 

## 2012-07-27 NOTE — Progress Notes (Signed)
[facility], [id number], approved oxycontin 15mg  60 tabs from 07/27/12-04/18/13.

## 2012-07-28 ENCOUNTER — Ambulatory Visit (HOSPITAL_BASED_OUTPATIENT_CLINIC_OR_DEPARTMENT_OTHER): Payer: Medicare Other

## 2012-07-28 VITALS — BP 109/60 | HR 77 | Temp 98.2°F

## 2012-07-28 DIAGNOSIS — Z452 Encounter for adjustment and management of vascular access device: Secondary | ICD-10-CM

## 2012-07-28 DIAGNOSIS — C189 Malignant neoplasm of colon, unspecified: Secondary | ICD-10-CM

## 2012-07-28 DIAGNOSIS — C50919 Malignant neoplasm of unspecified site of unspecified female breast: Secondary | ICD-10-CM

## 2012-07-28 MED ORDER — HEPARIN SOD (PORK) LOCK FLUSH 100 UNIT/ML IV SOLN
500.0000 [IU] | Freq: Once | INTRAVENOUS | Status: AC | PRN
  Administered 2012-07-28: 500 [IU]
  Filled 2012-07-28: qty 5

## 2012-07-28 MED ORDER — SODIUM CHLORIDE 0.9 % IJ SOLN
10.0000 mL | INTRAMUSCULAR | Status: DC | PRN
  Administered 2012-07-28: 10 mL
  Filled 2012-07-28: qty 10

## 2012-07-30 ENCOUNTER — Emergency Department (HOSPITAL_COMMUNITY)
Admission: EM | Admit: 2012-07-30 | Discharge: 2012-07-30 | Disposition: A | Discharge status: Home / Self Care | Payer: Medicare Other | Attending: Emergency Medicine | Admitting: Emergency Medicine

## 2012-07-30 ENCOUNTER — Emergency Department (HOSPITAL_COMMUNITY): Payer: Medicare Other

## 2012-07-30 ENCOUNTER — Encounter (HOSPITAL_COMMUNITY): Payer: Self-pay | Admitting: Emergency Medicine

## 2012-07-30 DIAGNOSIS — R443 Hallucinations, unspecified: Secondary | ICD-10-CM | POA: Insufficient documentation

## 2012-07-30 DIAGNOSIS — Z862 Personal history of diseases of the blood and blood-forming organs and certain disorders involving the immune mechanism: Secondary | ICD-10-CM | POA: Insufficient documentation

## 2012-07-30 DIAGNOSIS — R109 Unspecified abdominal pain: Secondary | ICD-10-CM | POA: Insufficient documentation

## 2012-07-30 DIAGNOSIS — R197 Diarrhea, unspecified: Secondary | ICD-10-CM | POA: Insufficient documentation

## 2012-07-30 DIAGNOSIS — C189 Malignant neoplasm of colon, unspecified: Secondary | ICD-10-CM | POA: Insufficient documentation

## 2012-07-30 DIAGNOSIS — Z79899 Other long term (current) drug therapy: Secondary | ICD-10-CM | POA: Insufficient documentation

## 2012-07-30 LAB — URINALYSIS, ROUTINE W REFLEX MICROSCOPIC
Hgb urine dipstick: NEGATIVE
Leukocytes, UA: NEGATIVE
Nitrite: NEGATIVE
Protein, ur: NEGATIVE mg/dL
Specific Gravity, Urine: 1.029 (ref 1.005–1.030)
Urobilinogen, UA: 0.2 mg/dL (ref 0.0–1.0)

## 2012-07-30 LAB — COMPREHENSIVE METABOLIC PANEL
BUN: 23 mg/dL (ref 6–23)
CO2: 25 mEq/L (ref 19–32)
Calcium: 8.8 mg/dL (ref 8.4–10.5)
Creatinine, Ser: 0.71 mg/dL (ref 0.50–1.10)
GFR calc Af Amer: >90 mL/min (ref >90)
GFR calc non Af Amer: 89 mL/min — ABNORMAL LOW (ref >90)
Glucose, Bld: 106 mg/dL — ABNORMAL HIGH (ref 70–99)
Total Protein: 6.3 g/dL (ref 6.0–8.3)

## 2012-07-30 LAB — CBC WITH DIFFERENTIAL/PLATELET
Basophils Absolute: 0 10*3/uL (ref 0.0–0.1)
Eosinophils Relative: 0 % (ref 0–5)
Lymphocytes Relative: 13 % (ref 12–46)
MCV: 79.8 fL (ref 78.0–100.0)
Neutrophils Relative %: 86 % — ABNORMAL HIGH (ref 43–77)
Platelets: 338 10*3/uL (ref 150–400)
RDW: 15.1 % (ref 11.5–15.5)
WBC: 12.1 10*3/uL — ABNORMAL HIGH (ref 4.0–10.5)

## 2012-07-30 LAB — LIPASE, BLOOD: Lipase: 14 U/L (ref 11–59)

## 2012-07-30 MED ORDER — SODIUM CHLORIDE 0.9 % IV SOLN
1000.0000 mL | INTRAVENOUS | Status: DC
Start: 2012-07-30 — End: 2012-07-30
  Administered 2012-07-30: 1000 mL via INTRAVENOUS

## 2012-07-30 MED ORDER — HYDROMORPHONE HCL PF 1 MG/ML IJ SOLN
1.0000 mg | INTRAMUSCULAR | Status: DC | PRN
  Administered 2012-07-30 (×2): 1 mg via INTRAVENOUS
  Filled 2012-07-30 (×2): qty 1

## 2012-07-30 MED ORDER — SODIUM CHLORIDE 0.9 % IV SOLN
1000.0000 mL | Freq: Once | INTRAVENOUS | Status: AC
  Administered 2012-07-30: 1000 mL via INTRAVENOUS

## 2012-07-30 MED ORDER — ONDANSETRON 8 MG PO TBDP: 8.0000 mg | ORAL_TABLET | Freq: Three times a day (TID) | ORAL | Status: DC | PRN

## 2012-07-30 MED ORDER — ONDANSETRON HCL 4 MG/2ML IJ SOLN
4.0000 mg | Freq: Four times a day (QID) | INTRAMUSCULAR | Status: DC | PRN
Start: 2012-07-30 — End: 2012-07-30
  Administered 2012-07-30 (×2): 4 mg via INTRAVENOUS
  Filled 2012-07-30 (×2): qty 2

## 2012-07-30 MED ORDER — HEPARIN SOD (PORK) LOCK FLUSH 100 UNIT/ML IV SOLN
INTRAVENOUS | Status: AC
  Filled 2012-07-30: qty 5

## 2012-07-30 NOTE — ED Notes (Signed)
Pt had chemo on Wed for stage 4 colon cancer, got her bag off on Friday and started having abdominal pain, n/v/d on Saturday. Pt states that her pain meds havent helped with the abd pain at all.  Pt has port in her left arm.

## 2012-07-30 NOTE — ED Notes (Signed)
Pt is aware of the need for a urine sample. Pt unable to void at this time. 

## 2012-07-30 NOTE — ED Provider Notes (Signed)
History    CSN: [id number] Arrival date & time 07/30/12  1305 First MD Initiated Contact with Patient 07/30/12 1313    Chief Complaint  Patient presents with  . Emesis  . Diarrhea  . Abdominal Pain    HPI Pt has history of stage four colon ca.   She last received her chemo treatment on Wednesday.  The infusion completed on Friday.  Pt has been having nausea, vomiting, and diarrhea since yesterday.    Pt has vomited 5-6 times today.  She has also had 5-6 loose stools.  No diarrhea.  Pt has had these side effects with her chemo before.  She is feeling week and dehydrated.  No fever.  No blood in stool or emesis.  Past Medical History  Diagnosis Date  . 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 Anemia 08/2011    Past Surgical History  Procedure Laterality Date  . Appendectomy    . Colon resection    . Knee arthroscopy Left   . Portacath placement Left 01/2012    left arm    No family history on file.  History  Substance Use Topics  . Smoking status: Never Smoker   . Smokeless tobacco: Not on file  . Alcohol Use: No    OB History   Grav Para Term Preterm Abortions TAB SAB Ect Mult Living                  Review of Systems  All other systems reviewed and are negative.    Allergies  Ativan and Benadryl  Home Medications   Current Outpatient Rx  Name  Route  Sig  Dispense  Refill  . ALPRAZolam (XANAX) 0.25 MG tablet   Oral   Take 1 tablet (0.25 mg total) by mouth 3 (three) times daily as needed for sleep or anxiety.   30 tablet   0   . HYDROcodone-acetaminophen (NORCO/VICODIN) 5-325 MG per tablet   Oral   Take 1-2 tablets by mouth every 4 (four) hours as needed.   45 tablet   0   . loperamide (IMODIUM) 2 MG capsule   Oral   Take 1 capsule (2 mg total) by mouth 4 (four) times daily as needed for diarrhea or loose stools (or as directed for irinotecan diarrhea).   30 capsule   0   . Multiple Vitamin (MULTIVITAMIN WITH MINERALS) TABS   Oral  Take 1 tablet by mouth daily.   30 tablet   0   . ondansetron (ZOFRAN) 4 MG tablet   Oral   Take 1 tablet (4 mg total) by mouth every 6 (six) hours as needed for nausea.   60 tablet   0   . ondansetron (ZOFRAN-ODT) 8 MG disintegrating tablet   Oral   Take 1 tablet (8 mg total) by mouth every 8 (eight) hours as needed for nausea.   20 tablet   3   . Oxycodone HCl 10 MG TABS   Oral   Take 1 tablet (10 mg total) by mouth every 4 (four) hours as needed (pain).   75 tablet   0   . pegfilgrastim (NEULASTA) 6 MG/0.6ML injection   Subcutaneous   Inject 6 mg into the skin every 14 (fourteen) days.         . potassium chloride SA (K-DUR,KLOR-CON) 20 MEQ tablet   Oral   Take 20 mEq by mouth daily.         . prochlorperazine (COMPAZINE) 10  MG tablet   Oral   Take 1 tablet (10 mg total) by mouth every 6 (six) hours as needed.   30 tablet   0   . zolpidem (AMBIEN) 5 MG tablet   Oral   Take 1 tablet (5 mg total) by mouth at bedtime as needed for sleep (insomnia).   30 tablet   0   . ondansetron (ZOFRAN ODT) 8 MG disintegrating tablet   Oral   Take 1 tablet (8 mg total) by mouth every 8 (eight) hours as needed for nausea.   20 tablet   0   . OxyCODONE (OXYCONTIN) 15 mg T12A   Oral   Take 1 tablet (15 mg total) by mouth every 12 (twelve) hours.   60 tablet   0   . PRESCRIPTION MEDICATION      Receiving chemotherapy at [facility] (Dr. Truett Perna).  Regimen is Avastin/FOLFIRI  every 14 days.  Last treatment was 07/12/12, next planned for 07/26/12.           BP 126/69  Pulse 90  Temp(Src) 98.5 F (36.9 C) (Oral)  Resp 16  SpO2 98%  Physical Exam  Nursing note and vitals reviewed. Constitutional: She appears well-developed and well-nourished. No distress.  HENT:  Head: Normocephalic and atraumatic.  Right Ear: External ear normal.  Left Ear: External ear normal.  Eyes: Conjunctivae are normal. Right eye exhibits no discharge. Left eye exhibits no discharge. No scleral  icterus.  Neck: Neck supple. No tracheal deviation present.  Cardiovascular: Normal rate, regular rhythm and intact distal pulses.   Pulmonary/Chest: Effort normal and breath sounds normal. No stridor. No respiratory distress. She has no wheezes. She has no rales.  Abdominal: Soft. Bowel sounds are normal. She exhibits no distension. There is tenderness. There is no rebound and no guarding.  Mild ttp lower abdomen inferior to surgical scar,   Musculoskeletal: She exhibits no edema and no tenderness.  Neurological: She is alert. She has normal strength. No sensory deficit. Cranial nerve deficit:  no gross defecits noted. She exhibits normal muscle tone. She displays no seizure activity. Coordination normal.  Skin: Skin is warm and dry. No rash noted.  Psychiatric: She has a normal mood and affect.    ED Course  Procedures (including critical care time)  Labs Reviewed  COMPREHENSIVE METABOLIC PANEL - Abnormal; Notable for the following:    Sodium 129 (*)    Chloride 94 (*)    Glucose, Bld 106 (*)    Albumin 2.9 (*)    GFR calc non Af Amer 89 (*)    All other components within normal limits  CBC WITH DIFFERENTIAL - Abnormal; Notable for the following:    WBC 12.1 (*)    Hemoglobin 10.8 (*)    HCT 32.8 (*)    Neutrophils Relative 86 (*)    Neutro Abs 10.4 (*)    Monocytes Relative 1 (*)    All other components within normal limits  LIPASE, BLOOD  URINALYSIS, ROUTINE W REFLEX MICROSCOPIC   Dg Abd Acute W/chest  07/30/2012  *RADIOLOGY REPORT*  Clinical Data: Abdominal pain, nausea/vomiting/diarrhea, colon cancer  ACUTE ABDOMEN SERIES (ABDOMEN 2 VIEW & CHEST 1 VIEW)  Comparison: 07/19/2012  Findings: Lungs are clear. No pleural effusion or pneumothorax.  Cardiomediastinal silhouette is within normal limits.  Nonspecific bowel gas pattern without disproportionate dilatation of small bowel to suggest obstruction.  Transverse colon is not decompressed.  However, there are multiple air fluid  levels on the upright radiograph, raising the  possibility of small bowel enteritis.  Surgical sutures overlying the right mid/lower abdomen.  No evidence of free air under the diaphragm on the upright view.  Surgical clips overlying the lower abdomen.  IMPRESSION: No evidence of acute cardiopulmonary disease.  Nonspecific bowel gas pattern but without findings to suggest small bowel obstruction.  Multiple air fluid levels, possibly reflecting small bowel enteritis.  No free air.   Original Report Authenticated By: Charline Bills, M.D.      1. Chemotherapy induced nausea and vomiting, initial encounter       MDM  The patient  improved with IV fluids antinausea meds.  She has been able to tolerate oral fluids in the emergency department. Her laboratory tests did not show any significant abnormalities. Patient already has medications for nausea and pain at home. She'll be discharged home and understands to return to the emergency department worsening symptoms       Celene Kras, MD 07/30/12 1558

## 2012-07-31 ENCOUNTER — Telehealth: Payer: Self-pay | Admitting: Dietician

## 2012-07-31 ENCOUNTER — Ambulatory Visit (HOSPITAL_BASED_OUTPATIENT_CLINIC_OR_DEPARTMENT_OTHER): Payer: Medicare Other

## 2012-07-31 ENCOUNTER — Other Ambulatory Visit: Payer: Self-pay | Admitting: *Deleted

## 2012-07-31 ENCOUNTER — Telehealth: Payer: Self-pay | Admitting: *Deleted

## 2012-07-31 ENCOUNTER — Ambulatory Visit (HOSPITAL_BASED_OUTPATIENT_CLINIC_OR_DEPARTMENT_OTHER): Payer: Medicare Other | Admitting: Oncology

## 2012-07-31 VITALS — BP 97/68 | HR 94 | Temp 98.1°F | Resp 18 | Ht 63.0 in

## 2012-07-31 DIAGNOSIS — R112 Nausea with vomiting, unspecified: Secondary | ICD-10-CM

## 2012-07-31 DIAGNOSIS — C779 Secondary and unspecified malignant neoplasm of lymph node, unspecified: Secondary | ICD-10-CM

## 2012-07-31 DIAGNOSIS — C189 Malignant neoplasm of colon, unspecified: Secondary | ICD-10-CM

## 2012-07-31 DIAGNOSIS — R197 Diarrhea, unspecified: Secondary | ICD-10-CM

## 2012-07-31 DIAGNOSIS — C50919 Malignant neoplasm of unspecified site of unspecified female breast: Secondary | ICD-10-CM

## 2012-07-31 MED ORDER — DIAZEPAM 5 MG/ML IJ SOLN
2.5000 mg | Freq: Once | INTRAMUSCULAR | Status: AC
  Administered 2012-07-31: 2.5 mg via INTRAVENOUS

## 2012-07-31 MED ORDER — DEXAMETHASONE 4 MG PO TABS: ORAL_TABLET | ORAL | Status: DC

## 2012-07-31 MED ORDER — DEXAMETHASONE SODIUM PHOSPHATE 10 MG/ML IJ SOLN
10.0000 mg | Freq: Once | INTRAMUSCULAR | Status: AC
  Administered 2012-07-31: 10 mg via INTRAVENOUS

## 2012-07-31 MED ORDER — SODIUM CHLORIDE 0.9 % IV SOLN
INTRAVENOUS | Status: DC
  Administered 2012-07-31: 13:00:00 via INTRAVENOUS

## 2012-07-31 MED ORDER — SODIUM CHLORIDE 0.9 % IV SOLN
Freq: Once | INTRAVENOUS | Status: AC
  Administered 2012-07-31: 15:00:00 via INTRAVENOUS

## 2012-07-31 NOTE — Telephone Encounter (Signed)
PT. HAS VOMITED X12 IN THE PAST 24 HOURS. SHE HAS HAD DIARRHEA X7 IN THE PAST 24 HOURS. PT. FLUID INTAKE IN THE PAST 24 HOURS HAS BEEN LESS THAN 8 OUNCES. VERBAL ORDER AND READ BACK TO DR.SHERRILL- HAVE PT. COME TO THIS OFFICE FOR IV FLUIDS AND MEDICATIONS. NOTIFIED MS.WHITE TO HAVE PT. AT THIS OFFICE AT 12 NOON FOR IV FLUIDS AND MEDICATIONS. SHE VOICES UNDERSTANDING.

## 2012-07-31 NOTE — Telephone Encounter (Signed)
Brief Outpatient Oncology Nutrition Note  Patient has been identified to be at risk on malnutrition screen.   Wt Readings from Last 10 Encounters:  07/26/12 140 lb 11.2 oz (63.821 kg)  07/20/12 137 lb 4.8 oz (62.279 kg)  07/12/12 145 lb 6.4 oz (65.953 kg)  06/29/12 152 lb (68.947 kg)  06/06/12 159 lb (72.122 kg)  11/22/11 198 lb 14.4 oz (90.22 kg)    Chart reviewed.  Patient with N/V/D coming in for IVF today.  I called and spoke with the patient 3/17.  RD contact info provided at that time.    Based on current status will refer to [facility].  Oran Rein, RD, LDN

## 2012-07-31 NOTE — Progress Notes (Signed)
   [facility]    OFFICE PROGRESS NOTE   INTERVAL HISTORY:   Kristen Alexander returns for an unscheduled visit. She completed another cycle of FOLFIRI/Avastin on 07/26/2012. She was premedicated with Aloxi, Decadron, and Emend. She reports the onset of nausea/vomiting and diarrhea beginning 07/29/2012. She received intravenous fluids and anti-emetics in the emergency room yesterday. She felt better last night, but earlier this morning she noted the onset of nausea/vomiting and diarrhea. She is unable to eat or drink at present.  Objective:  Vital signs in last 24 hours:  Blood pressure 97/68, pulse 94, temperature 98.1 F (36.7 C), temperature source Oral, resp. rate 18, height 5\' 3"  (1.6 m).    HEENT: No thrush or ulcers Resp: Lungs clear bilaterally Cardio: Regular rate and rhythm GI: No hepatomegaly, bowel sounds are present, nontender Vascular: No leg edema Neuro: Alert and oriented    Portacath/PICC-without erythema  Lab Results:  Lab Results  Component Value Date   WBC 12.1* 07/30/2012   HGB 10.8* 07/30/2012   HCT 32.8* 07/30/2012   MCV 79.8 07/30/2012   PLT 338 07/30/2012   ANC 10.4  Potassium 4.1, sodium 129, BUN 23, creatinine 0.71    Medications: I have reviewed the patient's current medications.  Assessment/Plan: 1. 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 Status post FOLFOX (3 cycles) with progressive nausea and weight loss. Chemotherapy switched to FOLFIRI (2 cycles). Admission with a bowel obstruction December 2013 status post jejuno-colic anastomosis with resolution of obstructive symptoms, progressive carcinomatosis noted at the time of surgery. Status post FOLFIRI/Avastin cycle 1 on 06/14/2012. 2. History of a microcytic anemia. Likely iron deficiency. 3. Weight loss  secondary to progressive metastatic colon cancer with a bowel obstruction. 4. Left arm passport. 5. Crampy abdominal pain during the irinotecan infusion cycle 2 relieved with atropine. 6. Diarrhea following cycle 2 FOLFIRI/Avastin. The diarrhea was controlled with Imodium. 7. Admission 07/19/2012 with intractable nausea/vomiting 8. Abdominal pain-? Secondary to direct tumor pain versus partial obstruction 9. Nausea/vomiting/diarrhea beginning on day for following the FOLFIRI/Avastin given on 07/26/2012-likely irinotecan-induced nausea and enteritis  Disposition:  Kristen Alexander again developed severe nausea and vomiting following FOLFIRI. I suspect the symptoms are related to irinotecan. She will receive intravenous fluids and antiemetics today and tomorrow. We will add Decadron to the antiemetic regimen. She reports an adverse reaction to lorazepam. Kristen Alexander will return for an office visit on 08/01/2012. We will be sure she is able to tolerate fluids prior to leaving the office today.   Thornton Papas, MD  07/31/2012  1:02 PM

## 2012-07-31 NOTE — Telephone Encounter (Signed)
Per staff message and POF I have scheduled appts.  JMW  

## 2012-07-31 NOTE — Telephone Encounter (Signed)
gv and printed appt schedule for pt for April and May...emailed michelle to add tx....pt aware °

## 2012-07-31 NOTE — Patient Instructions (Addendum)
Dehydration, Adult Dehydration is when you lose more fluids from the body than you take in. Vital organs like the kidneys, brain, and heart cannot function without a proper amount of fluids and salt. Any loss of fluids from the body can cause dehydration.  CAUSES   Vomiting.  Diarrhea.  Excessive sweating.  Excessive urine output.  Fever. SYMPTOMS  Mild dehydration  Thirst.  Dry lips.  Slightly dry mouth. Moderate dehydration  Very dry mouth.  Sunken eyes.  Skin does not bounce back quickly when lightly pinched and released.  Dark urine and decreased urine production.  Decreased tear production.  Headache. Severe dehydration  Very dry mouth.  Extreme thirst.  Rapid, weak pulse (more than 100 beats per minute at rest).  Cold hands and feet.  Not able to sweat in spite of heat and temperature.  Rapid breathing.  Blue lips.  Confusion and lethargy.  Difficulty being awakened.  Minimal urine production.  No tears. DIAGNOSIS  Your caregiver will diagnose dehydration based on your symptoms and your exam. Blood and urine tests will help confirm the diagnosis. The diagnostic evaluation should also identify the cause of dehydration. TREATMENT  Treatment of mild or moderate dehydration can often be done at home by increasing the amount of fluids that you drink. It is best to drink small amounts of fluid more often. Drinking too much at one time can make vomiting worse. Refer to the home care instructions below. Severe dehydration needs to be treated at the hospital where you will probably be given intravenous (IV) fluids that contain water and electrolytes. HOME CARE INSTRUCTIONS   Ask your caregiver about specific rehydration instructions.  Drink enough fluids to keep your urine clear or pale yellow.  Drink small amounts frequently if you have nausea and vomiting.  Eat as you normally do.  Avoid:  Foods or drinks high in sugar.  Carbonated  drinks.  Juice.  Extremely hot or cold fluids.  Drinks with caffeine.  Fatty, greasy foods.  Alcohol.  Tobacco.  Overeating.  Gelatin desserts.  Wash your hands well to avoid spreading bacteria and viruses.  Only take over-the-counter or prescription medicines for pain, discomfort, or fever as directed by your caregiver.  Ask your caregiver if you should continue all prescribed and over-the-counter medicines.  Keep all follow-up appointments with your caregiver. SEEK MEDICAL CARE IF:  You have abdominal pain and it increases or stays in one area (localizes).  You have a rash, stiff neck, or severe headache.  You are irritable, sleepy, or difficult to awaken.  You are weak, dizzy, or extremely thirsty. SEEK IMMEDIATE MEDICAL CARE IF:   You are unable to keep fluids down or you get worse despite treatment.  You have frequent episodes of vomiting or diarrhea.  You have blood or green matter (bile) in your vomit.  You have blood in your stool or your stool looks black and tarry.  You have not urinated in 6 to 8 hours, or you have only urinated a small amount of very dark urine.  You have a fever.  You faint. MAKE SURE YOU:   Understand these instructions.  Will watch your condition.  Will get help right away if you are not doing well or get worse. Document Released: 04/05/2005 Document Revised: 06/28/2011 Document Reviewed: 11/23/2010 Exit[employer] Patient Information 2013 [employer].  

## 2012-08-01 ENCOUNTER — Ambulatory Visit (HOSPITAL_BASED_OUTPATIENT_CLINIC_OR_DEPARTMENT_OTHER): Payer: Medicare Other | Admitting: Nurse Practitioner

## 2012-08-01 ENCOUNTER — Other Ambulatory Visit: Payer: Self-pay | Admitting: Nurse Practitioner

## 2012-08-01 ENCOUNTER — Encounter: Payer: Self-pay | Admitting: Nurse Practitioner

## 2012-08-01 ENCOUNTER — Ambulatory Visit (HOSPITAL_BASED_OUTPATIENT_CLINIC_OR_DEPARTMENT_OTHER): Payer: Medicare Other

## 2012-08-01 ENCOUNTER — Other Ambulatory Visit (HOSPITAL_BASED_OUTPATIENT_CLINIC_OR_DEPARTMENT_OTHER): Payer: Medicare Other | Admitting: Lab

## 2012-08-01 VITALS — BP 106/73 | HR 98 | Temp 96.0°F | Resp 17 | Ht 63.0 in | Wt 137.5 lb

## 2012-08-01 DIAGNOSIS — R5381 Other malaise: Secondary | ICD-10-CM

## 2012-08-01 DIAGNOSIS — C180 Malignant neoplasm of cecum: Secondary | ICD-10-CM

## 2012-08-01 DIAGNOSIS — R112 Nausea with vomiting, unspecified: Secondary | ICD-10-CM

## 2012-08-01 DIAGNOSIS — C189 Malignant neoplasm of colon, unspecified: Secondary | ICD-10-CM

## 2012-08-01 DIAGNOSIS — C800 Disseminated malignant neoplasm, unspecified: Secondary | ICD-10-CM

## 2012-08-01 DIAGNOSIS — F411 Generalized anxiety disorder: Secondary | ICD-10-CM

## 2012-08-01 DIAGNOSIS — D509 Iron deficiency anemia, unspecified: Secondary | ICD-10-CM

## 2012-08-01 DIAGNOSIS — R5383 Other fatigue: Secondary | ICD-10-CM

## 2012-08-01 LAB — BASIC METABOLIC PANEL (CC13)
CO2: 21 mEq/L — ABNORMAL LOW (ref 22–29)
Calcium: 8 mg/dL — ABNORMAL LOW (ref 8.4–10.4)
Potassium: 3.4 mEq/L — ABNORMAL LOW (ref 3.5–5.1)
Sodium: 134 mEq/L — ABNORMAL LOW (ref 136–145)

## 2012-08-01 MED ORDER — HEPARIN SOD (PORK) LOCK FLUSH 100 UNIT/ML IV SOLN
500.0000 [IU] | Freq: Once | INTRAVENOUS | Status: AC
  Administered 2012-08-01: 500 [IU] via INTRAVENOUS
  Filled 2012-08-01: qty 5

## 2012-08-01 MED ORDER — SODIUM CHLORIDE 0.9 % IJ SOLN
10.0000 mL | INTRAMUSCULAR | Status: DC | PRN
  Administered 2012-08-01: 10 mL via INTRAVENOUS
  Filled 2012-08-01: qty 10

## 2012-08-01 MED ORDER — DIAZEPAM 5 MG/ML IJ SOLN
5.0000 mg | Freq: Once | INTRAMUSCULAR | Status: AC
  Administered 2012-08-01: 5 mg via INTRAVENOUS

## 2012-08-01 MED ORDER — ATROPINE SULFATE 1 MG/ML IJ SOLN
0.5000 mg | Freq: Once | INTRAMUSCULAR | Status: AC
  Administered 2012-08-01: 0.5 mg via INTRAVENOUS

## 2012-08-01 MED ORDER — DEXAMETHASONE SODIUM PHOSPHATE 10 MG/ML IJ SOLN
10.0000 mg | Freq: Once | INTRAMUSCULAR | Status: AC
  Administered 2012-08-01: 10 mg via INTRAVENOUS

## 2012-08-01 MED ORDER — SODIUM CHLORIDE 0.9 % IV SOLN
Freq: Once | INTRAVENOUS | Status: AC
  Administered 2012-08-01: 10:00:00 via INTRAVENOUS

## 2012-08-01 NOTE — Progress Notes (Signed)
[facility]    OFFICE PROGRESS NOTE   INTERVAL HISTORY:   Kristen Alexander returns for follow up from yesterday when she was seen due to severe nausea and vomiting. . She completed another cycle of FOLFIRI/Avastin on 07/26/2012. She was premedicated with Aloxi, Decadron, and Emend. She reports the onset of nausea/vomiting and diarrhea beginning 07/29/2012. She received intravenous fluids and anti-emetics in the emergency room on Sunday, again in our office yesterday. She is doing considerably better than yesterday with no diarrhea and no vomiting since leaving our office, but minimal oral intake. She is still very weak and somewhat anxious. She is accompanied by her sister. We will treat her again today with intravenous fluids, decadron, anti-emetics, and valium. She is hopeful she will be able to tolerate solid foods after today.   Objective:  Vital signs in last 24 hours:  Blood pressure 106/73, pulse 98, temperature 96 F (35.6 C), temperature source Oral, resp. rate 17, height 5\' 3"  (1.6 m), weight 137 lb 8 oz (62.37 kg).    HEENT: No thrush or ulcers Resp: Lungs clear bilaterally Cardio: Regular rate and rhythm GI: No hepatomegaly, bowel sounds are present, nontender Vascular: No leg edema Neuro: Alert and oriented    Portacath/PICC-without erythema  Lab Results:  Lab Results  Component Value Date   WBC 12.1* 07/30/2012   HGB 10.8* 07/30/2012   HCT 32.8* 07/30/2012   MCV 79.8 07/30/2012   PLT 338 07/30/2012    CMP pending.     Medications: I have reviewed the patient's current medications.  Assessment/Plan: 1. 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 Status post FOLFOX (3 cycles) with progressive nausea and weight loss. Chemotherapy switched to FOLFIRI (2 cycles). Admission with a  bowel obstruction December 2013 status post jejuno-colic anastomosis with resolution of obstructive symptoms, progressive carcinomatosis noted at the time of surgery. Status post FOLFIRI/Avastin cycle 1 on 06/14/2012. 2. History of a microcytic anemia. Likely iron deficiency. 3. Weight loss secondary to progressive metastatic colon cancer with a bowel obstruction. 4. Left arm passport. 5. Crampy abdominal pain during the irinotecan infusion cycle 2 relieved with atropine. 6. Diarrhea following cycle 2 FOLFIRI/Avastin. The diarrhea was controlled with Imodium. 7. Admission 07/19/2012 with intractable nausea/vomiting 8. Abdominal pain-? Secondary to direct tumor pain versus partial obstruction 9. Nausea/vomiting/diarrhea beginning on day for following the FOLFIRI/Avastin given on 07/26/2012-likely irinotecan-induced nausea and enteritis  Disposition: Yesterday Kristen Alexander was seen due to severe nausea and vomiting following FOLFIRI - we suspect it is the irinotecan causing the majority of her symptoms. She received IV fluids, decadron and valium. She was instructed to take oral decadron at home but did not yet pick up the prescription. She stated she will do so today on her way home.   Kristen Alexander is doing considerably better than she was 24 hours ago, but still appears weak and has not had sufficient oral intake. We will treat her again today with intravenous fluids, IV Decadron, as well as IV Valium for her anxiety.  She voiced understanding of the need to add oral Decadron to her current antiemetic regimen. Her goal is to go to her annual family Easter beach trip this weekend. She has not had diarrhea or vomiting since leaving our office yesterday, although she has very little solid food during that time either. Her sister was with  her today and we emphasized need for her to let us know if she has intractable nausea, vomiting or diarrhea prior to this weekend. Hopefully after today she will have  turned the corner. We plan to see her back next week for her next FOLFIRI treatment.   She was seen in collaboration with Dr. [name].   ANGENI, CHAUDHURI, NP-C   08/01/2012  10:25 AM

## 2012-08-01 NOTE — Patient Instructions (Signed)
Dehydration, Adult Dehydration is when you lose more fluids from the body than you take in. Vital organs like the kidneys, brain, and heart cannot function without a proper amount of fluids and salt. Any loss of fluids from the body can cause dehydration.  CAUSES   Vomiting.  Diarrhea.  Excessive sweating.  Excessive urine output.  Fever. SYMPTOMS  Mild dehydration  Thirst.  Dry lips.  Slightly dry mouth. Moderate dehydration  Very dry mouth.  Sunken eyes.  Skin does not bounce back quickly when lightly pinched and released.  Dark urine and decreased urine production.  Decreased tear production.  Headache. Severe dehydration  Very dry mouth.  Extreme thirst.  Rapid, weak pulse (more than 100 beats per minute at rest).  Cold hands and feet.  Not able to sweat in spite of heat and temperature.  Rapid breathing.  Blue lips.  Confusion and lethargy.  Difficulty being awakened.  Minimal urine production.  No tears. DIAGNOSIS  Your caregiver will diagnose dehydration based on your symptoms and your exam. Blood and urine tests will help confirm the diagnosis. The diagnostic evaluation should also identify the cause of dehydration. TREATMENT  Treatment of mild or moderate dehydration can often be done at home by increasing the amount of fluids that you drink. It is best to drink small amounts of fluid more often. Drinking too much at one time can make vomiting worse. Refer to the home care instructions below. Severe dehydration needs to be treated at the hospital where you will probably be given intravenous (IV) fluids that contain water and electrolytes. HOME CARE INSTRUCTIONS   Ask your caregiver about specific rehydration instructions.  Drink enough fluids to keep your urine clear or pale yellow.  Drink small amounts frequently if you have nausea and vomiting.  Eat as you normally do.  Avoid:  Foods or drinks high in sugar.  Carbonated  drinks.  Juice.  Extremely hot or cold fluids.  Drinks with caffeine.  Fatty, greasy foods.  Alcohol.  Tobacco.  Overeating.  Gelatin desserts.  Wash your hands well to avoid spreading bacteria and viruses.  Only take over-the-counter or prescription medicines for pain, discomfort, or fever as directed by your caregiver.  Ask your caregiver if you should continue all prescribed and over-the-counter medicines.  Keep all follow-up appointments with your caregiver. SEEK MEDICAL CARE IF:  You have abdominal pain and it increases or stays in one area (localizes).  You have a rash, stiff neck, or severe headache.  You are irritable, sleepy, or difficult to awaken.  You are weak, dizzy, or extremely thirsty. SEEK IMMEDIATE MEDICAL CARE IF:   You are unable to keep fluids down or you get worse despite treatment.  You have frequent episodes of vomiting or diarrhea.  You have blood or green matter (bile) in your vomit.  You have blood in your stool or your stool looks black and tarry.  You have not urinated in 6 to 8 hours, or you have only urinated a small amount of very dark urine.  You have a fever.  You faint. MAKE SURE YOU:   Understand these instructions.  Will watch your condition.  Will get help right away if you are not doing well or get worse. Document Released: 04/05/2005 Document Revised: 06/28/2011 Document Reviewed: 11/23/2010 Exit[employer] Patient Information 2013 [employer].  

## 2012-08-01 NOTE — Progress Notes (Signed)
1135--pt c/o abdominal cramping.  Per Bobbe Medico, NP, okay to give 0.5 atropine IV. SLJ  1140--0.5mg  atropine given.  Atropine will not scan into MAR.  Per Renella Cunas, PharmD, okay to do manual override for "barcode unreadable".  Will continue to monitor pt.  SLJ  1220--Pt states complete relief from cramping.  SLJ

## 2012-08-06 ENCOUNTER — Other Ambulatory Visit: Payer: Self-pay | Admitting: Oncology

## 2012-08-07 ENCOUNTER — Encounter: Payer: Self-pay | Admitting: Oncology

## 2012-08-07 ENCOUNTER — Telehealth: Payer: Self-pay | Admitting: *Deleted

## 2012-08-07 NOTE — Telephone Encounter (Signed)
Left VM that she was admitted to [facility] in [place] and that they will be contacting office for records. Requests to be called.

## 2012-08-07 NOTE — Telephone Encounter (Signed)
Dr. Truett Perna spoke with physician at [facility] today regarding her case and provided information requested.

## 2012-08-07 NOTE — Telephone Encounter (Signed)
Spoke with friend and she wanted our office aware of admission. Doubts she will be in [place] for her appointment on Wednesday. They will call few days before she is able to travel back to area.

## 2012-08-09 ENCOUNTER — Ambulatory Visit: Payer: Medicare Other

## 2012-08-09 ENCOUNTER — Ambulatory Visit: Payer: Medicare Other | Admitting: Nurse Practitioner

## 2012-08-09 ENCOUNTER — Other Ambulatory Visit: Payer: Medicare Other | Admitting: Lab

## 2012-08-11 ENCOUNTER — Telehealth: Payer: Self-pay | Admitting: Dietician

## 2012-08-17 ENCOUNTER — Telehealth: Payer: Self-pay | Admitting: *Deleted

## 2012-08-17 ENCOUNTER — Other Ambulatory Visit: Payer: Self-pay | Admitting: *Deleted

## 2012-08-17 NOTE — Telephone Encounter (Signed)
Patient out of hospital, but still in [place]. Will have records from [place] faxed to office at [phone]. Will keep follow up appointment with Dr. Truett Perna on 5/7 if necessary, but would prefer to wait until following week when she sees Dr. Lenis Noon. Sees surgeon at [facility] on 08/30/12. Per Dr. Truett Perna, would be best to delay visit until after she sees Dr. Lenis Noon. Will not be giving chemo when she comes in. POF to scheduler to move her visit out to after surgeon.

## 2012-08-23 ENCOUNTER — Ambulatory Visit (HOSPITAL_BASED_OUTPATIENT_CLINIC_OR_DEPARTMENT_OTHER): Payer: Medicare Other | Admitting: Oncology

## 2012-08-23 ENCOUNTER — Ambulatory Visit: Payer: Medicare Other

## 2012-08-23 ENCOUNTER — Other Ambulatory Visit (HOSPITAL_BASED_OUTPATIENT_CLINIC_OR_DEPARTMENT_OTHER): Payer: Medicare Other | Admitting: Lab

## 2012-08-23 ENCOUNTER — Telehealth: Payer: Self-pay | Admitting: Oncology

## 2012-08-23 VITALS — BP 120/74 | HR 87 | Temp 98.0°F | Resp 18 | Ht 63.0 in | Wt 140.1 lb

## 2012-08-23 DIAGNOSIS — C189 Malignant neoplasm of colon, unspecified: Secondary | ICD-10-CM

## 2012-08-23 DIAGNOSIS — C800 Disseminated malignant neoplasm, unspecified: Secondary | ICD-10-CM

## 2012-08-23 DIAGNOSIS — R109 Unspecified abdominal pain: Secondary | ICD-10-CM

## 2012-08-23 DIAGNOSIS — C50919 Malignant neoplasm of unspecified site of unspecified female breast: Secondary | ICD-10-CM

## 2012-08-23 LAB — CBC WITH DIFFERENTIAL/PLATELET
Basophils Absolute: 0.1 10*3/uL (ref 0.0–0.1)
HCT: 35.8 % (ref 34.8–46.6)
HGB: 11.6 g/dL (ref 11.6–15.9)
MCH: 27.7 pg (ref 25.1–34.0)
MONO#: 0.7 10*3/uL (ref 0.1–0.9)
NEUT%: 65.9 % (ref 38.4–76.8)
WBC: 7 10*3/uL (ref 3.9–10.3)
lymph#: 1.4 10*3/uL (ref 0.9–3.3)

## 2012-08-23 LAB — COMPREHENSIVE METABOLIC PANEL (CC13)
Albumin: 2.5 g/dL — ABNORMAL LOW (ref 3.5–5.0)
BUN: 6.9 mg/dL — ABNORMAL LOW (ref 7.0–26.0)
Calcium: 9.2 mg/dL (ref 8.4–10.4)
Chloride: 106 mEq/L (ref 98–107)
Glucose: 96 mg/dl (ref 70–99)
Potassium: 3.8 mEq/L (ref 3.5–5.1)
Total Bilirubin: 0.29 mg/dL (ref 0.20–1.20)

## 2012-08-23 MED ORDER — HYDROCODONE-ACETAMINOPHEN 5-325 MG PO TABS: 1.0000 | ORAL_TABLET | ORAL | Status: DC | PRN

## 2012-08-23 NOTE — Progress Notes (Signed)
[facility]    OFFICE PROGRESS NOTE   INTERVAL HISTORY:   She was admitted to the hospital in [facility] on 08/06/2012 with nausea, abdominal pain, and leakage of stool from the abdominal wound. A CT of the abdomen revealed no change in a low density lesion in the left liver felt to potentially represent a cyst. A collection air and fluid was noted in the periumbilical soft tissues. No free or localized intrapelvic or intra-abdominal fluid. She was noted to be neutropenic and was placed on broad-spectrum antibiotics.  An open area was noted within the abdominal scar. This was treated as an abscess. 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 A colostomy bag was placed over the area while she was in the hospital. We will culture was positive for Escherichia coli. A urine culture was positive for strep agalactiae. She was seen in consultation by surgery. The white count had recovered and discharged on 08/11/2012.  She reports feeling well. No further drainage from the open abdominal wound. She continues to have intermittent abdominal pain. She takes hydrocodone approximately 4 times per day. No difficulty with bowel or bladder function.  Objective:  Vital signs in last 24 hours:  Blood pressure 120/74, pulse 87, temperature 98 F (36.7 C), temperature source Oral, resp. rate 18, height 5\' 3"  (1.6 m), weight 140 lb 1.6 oz (63.549 kg).    HEENT: no thrush or ulcers Resp: lungs clear bilaterally Cardio: regular rate and rhythm GI: no hepatomegaly, no apparent ascites, no mass, 1-2 cm opening at the mid aspect of the midline scar. No drainage or surrounding erythema. Vascular: no leg edema   Portacath/PICC-without erythema  Lab Results:  Lab Results  Component Value Date   WBC 7.0 08/23/2012   HGB 11.6 08/23/2012   HCT 35.8 08/23/2012   MCV 85.3 08/23/2012   PLT 397 08/23/2012  ANC 4.6    Medications: I have reviewed  the patient's current medications.  Assessment/Plan: 1. extended right colectomy on 09/17/2011 with pathology confirming a T4, N2, M1 tumor. 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 Status post FOLFOX (3 cycles) with progressive nausea and weight loss. Chemotherapy switched to FOLFIRI (2 cycles). Admission with a bowel obstruction December 2013 status post jejuno-colic anastomosis with resolution of obstructive symptoms, progressive carcinomatosis noted at the time of surgery. Status post FOLFIRI/Avastin cycle 1 on 06/14/2012.cycle 4 on 07/26/2012. 2. History of a microcytic anemia. Likely iron deficiency. 3. Weight loss secondary to progressive metastatic colon cancer with a bowel obstruction. 4. Left arm passport. 5. Crampy abdominal pain during the irinotecan infusion cycle 2 relieved with atropine. 6. Diarrhea following cycle 2 FOLFIRI/Avastin. The diarrhea was controlled with Imodium. 7. Admission 07/19/2012 with intractable nausea/vomiting 8. Abdominal pain-? Secondary to direct tumor pain versus partial obstruction 9. Nausea/vomiting/diarrhea beginning on day 4 following the FOLFIRI/Avastin given on 07/26/2012-likely irinotecan-induced nausea and enteritis 10. Admission with febrile neutropenia and an abdominal incision abscess/fistula 08/06/2012, the abdominal wound is open with no drainage at present.  Disposition:  She has been treated with FOLFIRI/Avastin over the past few months. The chemotherapy has been complicated by intractable nausea/vomiting. She was admitted with febrile neutropenia and an abdominal incision abscess/fistula while in [place] on 08/06/2012. She believes the fistula has "healed" since there is no longer drainage. The wound dehiscence/fistula may be related to Avastin therapy.   Ms. Viera is scheduled to see Dr. Lenis Noon next week. We have placed further  FOLFIRI/Avastin on hold. She will return for an office visit in 2-3 weeks. We will  consider additional salvage systemic therapy options based on the plan for debulking surgery/intraperitoneal therapy.   Thornton Papas, MD  08/23/2012  2:37 PM

## 2012-08-23 NOTE — Progress Notes (Signed)
Open wound mid abdomen from prior fistula/abscess drainage. Occasional small amount of clear to yellow discharge. Tissue is pink without odor.

## 2012-08-23 NOTE — Telephone Encounter (Signed)
gv and printed pt appt and avs for pt for May

## 2012-08-24 ENCOUNTER — Telehealth: Payer: Self-pay | Admitting: Oncology

## 2012-08-24 ENCOUNTER — Telehealth: Payer: Self-pay | Admitting: *Deleted

## 2012-08-24 DIAGNOSIS — C189 Malignant neoplasm of colon, unspecified: Secondary | ICD-10-CM

## 2012-08-24 MED ORDER — OXYCODONE HCL 10 MG PO TABS: 10.0000 mg | ORAL_TABLET | ORAL | Status: DC | PRN

## 2012-08-24 NOTE — Telephone Encounter (Signed)
RECORDS FAXED TO DR. Lenis Noon @ [phone]

## 2012-08-24 NOTE — Telephone Encounter (Signed)
Patient requested refill on wrong narcotic at visit yesterday according to her friend, Frazier Butt that helps her when she is in [place]. She got hydrocodone mixed up with oxycodone. Oxycodone is med she is out of that works better for her severe pain. Takes hydrocodone for mild pain. Patient did fill the hydrocodone yesterday thinking this was the correct med she was out of. Per Dr. Truett Perna, will refill #50 oxycodone 10 mg tabs at this time. Will request patient to bring her narcotic bottles to office with her in future to ease refill process.

## 2012-09-05 ENCOUNTER — Telehealth: Payer: Self-pay | Admitting: Oncology

## 2012-09-05 NOTE — Telephone Encounter (Signed)
Pt 's daughter called and wants to cancel appt for 5/23, pt having surgery @ [facility] on 6/9 by Dr. Donata Duff, pt will call to r/ s appt, notified nurse

## 2012-09-08 ENCOUNTER — Ambulatory Visit: Payer: Medicare Other | Admitting: Oncology

## 2012-09-08 ENCOUNTER — Other Ambulatory Visit: Payer: Medicare Other | Admitting: Lab

## 2012-09-19 ENCOUNTER — Other Ambulatory Visit: Payer: Self-pay | Admitting: *Deleted

## 2012-09-19 DIAGNOSIS — C189 Malignant neoplasm of colon, unspecified: Secondary | ICD-10-CM

## 2012-09-19 MED ORDER — OXYCODONE HCL 10 MG PO TABS: 10.0000 mg | ORAL_TABLET | ORAL | Status: DC | PRN

## 2012-09-19 NOTE — Telephone Encounter (Signed)
Needs refill on oxycodone 10 mg tablet (prn). Also reports surgery per Dr. Lenis Noon on 09/25/12 and will be in hospital 8-12 days. She says if this does not help, she will not take anymore chemo.

## 2012-10-16 ENCOUNTER — Encounter (HOSPITAL_COMMUNITY): Payer: Self-pay | Admitting: *Deleted

## 2012-10-16 ENCOUNTER — Emergency Department (HOSPITAL_COMMUNITY)
Admission: EM | Admit: 2012-10-16 | Discharge: 2012-10-16 | Disposition: A | Discharge status: Other Acute Inpatient Hospital | Payer: Medicare Other | Attending: Emergency Medicine | Admitting: Emergency Medicine

## 2012-10-16 ENCOUNTER — Other Ambulatory Visit: Payer: Self-pay

## 2012-10-16 ENCOUNTER — Emergency Department (HOSPITAL_COMMUNITY): Payer: Medicare Other

## 2012-10-16 DIAGNOSIS — K651 Peritoneal abscess: Secondary | ICD-10-CM | POA: Diagnosis present

## 2012-10-16 DIAGNOSIS — D72829 Elevated white blood cell count, unspecified: Secondary | ICD-10-CM

## 2012-10-16 DIAGNOSIS — A419 Sepsis, unspecified organism: Secondary | ICD-10-CM | POA: Diagnosis present

## 2012-10-16 DIAGNOSIS — R112 Nausea with vomiting, unspecified: Secondary | ICD-10-CM | POA: Diagnosis present

## 2012-10-16 DIAGNOSIS — N390 Urinary tract infection, site not specified: Secondary | ICD-10-CM | POA: Insufficient documentation

## 2012-10-16 DIAGNOSIS — D473 Essential (hemorrhagic) thrombocythemia: Secondary | ICD-10-CM | POA: Insufficient documentation

## 2012-10-16 DIAGNOSIS — L02219 Cutaneous abscess of trunk, unspecified: Secondary | ICD-10-CM | POA: Insufficient documentation

## 2012-10-16 DIAGNOSIS — Z79899 Other long term (current) drug therapy: Secondary | ICD-10-CM | POA: Insufficient documentation

## 2012-10-16 DIAGNOSIS — E871 Hypo-osmolality and hyponatremia: Secondary | ICD-10-CM | POA: Insufficient documentation

## 2012-10-16 DIAGNOSIS — C800 Disseminated malignant neoplasm, unspecified: Secondary | ICD-10-CM | POA: Diagnosis present

## 2012-10-16 DIAGNOSIS — R109 Unspecified abdominal pain: Secondary | ICD-10-CM | POA: Insufficient documentation

## 2012-10-16 DIAGNOSIS — Z85038 Personal history of other malignant neoplasm of large intestine: Secondary | ICD-10-CM | POA: Insufficient documentation

## 2012-10-16 DIAGNOSIS — C189 Malignant neoplasm of colon, unspecified: Secondary | ICD-10-CM | POA: Diagnosis present

## 2012-10-16 DIAGNOSIS — D649 Anemia, unspecified: Secondary | ICD-10-CM | POA: Insufficient documentation

## 2012-10-16 DIAGNOSIS — T8140XA Infection following a procedure, unspecified, initial encounter: Secondary | ICD-10-CM | POA: Insufficient documentation

## 2012-10-16 DIAGNOSIS — Y838 Other surgical procedures as the cause of abnormal reaction of the patient, or of later complication, without mention of misadventure at the time of the procedure: Secondary | ICD-10-CM | POA: Insufficient documentation

## 2012-10-16 DIAGNOSIS — E860 Dehydration: Secondary | ICD-10-CM | POA: Diagnosis present

## 2012-10-16 LAB — COMPREHENSIVE METABOLIC PANEL
AST: 15 U/L (ref 0–37)
Albumin: 2.3 g/dL — ABNORMAL LOW (ref 3.5–5.2)
Alkaline Phosphatase: 185 U/L — ABNORMAL HIGH (ref 39–117)
Chloride: 87 mEq/L — ABNORMAL LOW (ref 96–112)
Potassium: 3.7 mEq/L (ref 3.5–5.1)
Total Bilirubin: 0.3 mg/dL (ref 0.3–1.2)
Total Protein: 7.2 g/dL (ref 6.0–8.3)

## 2012-10-16 LAB — CBC WITH DIFFERENTIAL/PLATELET
Basophils Absolute: 0 10*3/uL (ref 0.0–0.1)
Eosinophils Relative: 0 % (ref 0–5)
Lymphocytes Relative: 3 % — ABNORMAL LOW (ref 12–46)
Monocytes Relative: 6 % (ref 3–12)
Neutrophils Relative %: 91 % — ABNORMAL HIGH (ref 43–77)
Platelets: 801 10*3/uL — ABNORMAL HIGH (ref 150–400)
RBC: 3.87 MIL/uL (ref 3.87–5.11)
RDW: 14.5 % (ref 11.5–15.5)
WBC: 28.3 10*3/uL — ABNORMAL HIGH (ref 4.0–10.5)

## 2012-10-16 LAB — URINALYSIS, ROUTINE W REFLEX MICROSCOPIC
Glucose, UA: NEGATIVE mg/dL
Protein, ur: 30 mg/dL — AB
pH: 8 (ref 5.0–8.0)

## 2012-10-16 LAB — LIPASE, BLOOD: Lipase: 29 U/L (ref 11–59)

## 2012-10-16 LAB — URINE MICROSCOPIC-ADD ON

## 2012-10-16 MED ORDER — SODIUM CHLORIDE 0.9 % IV SOLN
1000.0000 mL | Freq: Once | INTRAVENOUS | Status: AC
  Administered 2012-10-16: 1000 mL via INTRAVENOUS

## 2012-10-16 MED ORDER — HYDROMORPHONE HCL PF 1 MG/ML IJ SOLN
1.0000 mg | Freq: Once | INTRAMUSCULAR | Status: AC
  Administered 2012-10-16: 1 mg via INTRAVENOUS
  Filled 2012-10-16: qty 1

## 2012-10-16 MED ORDER — ONDANSETRON HCL 4 MG/2ML IJ SOLN
4.0000 mg | Freq: Once | INTRAMUSCULAR | Status: AC
  Administered 2012-10-16: 4 mg via INTRAVENOUS
  Filled 2012-10-16: qty 2

## 2012-10-16 MED ORDER — IOHEXOL 300 MG/ML  SOLN
100.0000 mL | Freq: Once | INTRAMUSCULAR | Status: AC | PRN
  Administered 2012-10-16: 100 mL via INTRAVENOUS

## 2012-10-16 MED ORDER — VANCOMYCIN HCL IN DEXTROSE 1-5 GM/200ML-% IV SOLN
1000.0000 mg | Freq: Once | INTRAVENOUS | Status: AC
  Administered 2012-10-16: 1000 mg via INTRAVENOUS
  Filled 2012-10-16: qty 200

## 2012-10-16 MED ORDER — IOHEXOL 300 MG/ML  SOLN
50.0000 mL | Freq: Once | INTRAMUSCULAR | Status: AC | PRN
  Administered 2012-10-16: 50 mL via ORAL

## 2012-10-16 MED ORDER — SODIUM CHLORIDE 0.9 % IV BOLUS (SEPSIS)
1000.0000 mL | Freq: Once | INTRAVENOUS | Status: AC
  Administered 2012-10-16: 1000 mL via INTRAVENOUS

## 2012-10-16 MED ORDER — DEXTROSE 5 % IV SOLN
1.0000 g | INTRAVENOUS | Status: DC
  Administered 2012-10-16: 1 g via INTRAVENOUS
  Filled 2012-10-16: qty 10

## 2012-10-16 MED ORDER — PIPERACILLIN-TAZOBACTAM 3.375 G IVPB
3.3750 g | Freq: Once | INTRAVENOUS | Status: DC
  Administered 2012-10-16: 3.375 g via INTRAVENOUS
  Filled 2012-10-16: qty 50

## 2012-10-16 NOTE — ED Notes (Signed)
Pt had surgery June 9th at [facility], colon resection, part of rectum removed and  Hysterectomy, due to ca, pt has colostomy, has not been able to obtain good pain control since coming home on June 19th, actively vomiting.

## 2012-10-16 NOTE — ED Notes (Signed)
[facility] at bedside 

## 2012-10-16 NOTE — ED Provider Notes (Signed)
The patient was seen by Dr. Preston Fleeting last evening. He consult with general surgery in the medical service here. Considering her recent surgery at [facility], they recommended transfer to that facility.  The patient's CT scan showed inflammation around the jejunum. She also has a loculated fluid collection which may represent an infected fluid/abscess extending from the right. Colic gutter through the pelvis into the left pericolic gutter.  I spoke with Dr Hyacinth Meeker, [facility].  He accepts transfer.  The patient is to go to the ED.    Celene Kras, MD 10/16/12 [phone]

## 2012-10-16 NOTE — ED Notes (Signed)
Report given RN [facility] ED.

## 2012-10-16 NOTE — ED Notes (Signed)
Pt is unable to urinate at this time. 

## 2012-10-16 NOTE — Consult Note (Signed)
[facility] Medical Consultation  Kristen Alexander ZOX:[id number] DOB: 1946-02-28 DOA: 10/16/2012 PCP: Herb Grays, MD  Primary Surgeon: Dr. Herbie Saxon, at [facility] Primary Oncologist: Dr. Leighton Roach. [place]  Requesting physician: Dr. 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 Sepsis/intra-abdominal abscess, status post recent major abdominal surgery (HIPEC) & intra-peritoneal chemotherapy: Patient is status post recent extensive abdominal surgery at [facility] where she was hospitalized for approximately 10 days and discharged on 10/05/12 with home health services and has a followup appointment on 10/18/12. She now presents with nausea, vomiting, abdominal pain, leukocytosis and CT abdomen suggesting intra-abdominal abscess. Recommend surgical consultation and will possibly benefit from transferring her back to her primary surgeons at [facility] for further management (this was discussed with Dr. Preston Fleeting). In the interim, continue aggressive IV fluids, broad-spectrum IV antibiotics-IV Zosyn and pain management. 2. Dehydration with hyponatremia: Secondary to problem #1, nausea and vomiting. IV saline hydration. 3. Urinary tract infection: IV Zosyn should cover. Follow urine culture results. 4. Anemia: Follow daily CBCs. 5. Leukocytosis: Secondary to sepsis. Management as above. 6. Thrombocytosis: Likely reactive. Follow daily CBCs. 7. Moderate protein-calorie malnutrition: Secondary to cancer and recent extensive  surgery. Will need nutrition consult at some point. 8. Colon Cancer (cecal/appendiceal)/abdominal carcinomatosis): Would benefit from transfer to [facility] for further management. 9. DVT prophylaxis: Continue Lovenox.   Chief Complaint: Worsening abdominal pain, nausea and vomiting  HPI:  67 year old female patient with history of colon cancer (cecal/appendiceal), abdominal carcinomatosis, extended right colectomy on 09/17/2011 at [facility], status post recent major abdominal surgery (HIPEC) and intraperitoneal chemotherapy at [facility] by Dr. Herbie Saxon, presented to the [facility] ED on 10/16/12 with worsening abdominal pain, nausea and vomiting. Patient states that since discharge from the hospital on 6/19, she has had intermittent 4/10 there will lower abdominal pain, continued poor appetite consuming mostly soft foods and liquids and undergoing home physical therapy. Last night after physical therapy, she complains of subacute worsening of lower abdominal pain rated at 12/10 in severity, nonradiating, uncomfortable in any position with no relieving factors. This was associated with nausea, vomiting of clear liquids but no coffee-ground or blood. No increased output through the ostomy. She denies fever or chills. She did complain of some dysuria that began yesterday. She also had palpitations but no chest pain, dyspnea or cough. In the ED, patient was initially tachycardic in the 150s which has improved to the 110s, WBC 28.3, hemoglobin 10.2, platelets 801, sodium 125, chloride 87, UA suspicious for UTI and CT abdomen and pelvis shows markedly inflamed loop of jejunum in the anatomic pelvis, no definite obstruction and probable infected fluid/abscess extending from right pericolic gutter the anatomic pelvis in the left pericolic gutter. Hospitalist consultation was requested.  Review of Systems:  All systems reviewed and apart from history of presenting  illness, are negative  Past Medical History  Diagnosis Date  . 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 Anemia 08/2011   Past Surgical History  Procedure Laterality Date  . Appendectomy    .  Colon resection    . Knee arthroscopy Left   . Portacath placement Left 01/2012    left arm   Social History:  reports that she has never smoked. She does not have any smokeless tobacco history on file. She reports that she does not drink alcohol or use illicit drugs. Single. Lives with her friend and is independent of activities of daily living.  Allergies  Allergen Reactions  . Ativan (Lorazepam) Other (See Comments)    Hyperactivity   . Benadryl (Diphenhydramine Hcl) Other (See Comments)    Restless and hyperactive   No family history on file.  Prior to Admission medications   Medication Sig Start Date End Date Taking? Authorizing Provider  enoxaparin (LOVENOX) 40 MG/0.4ML injection Inject 40 mg into the skin daily. Inject 0.4 mLs (40 mg total) into the skin daily for 14 days. 10/05/12 10/19/12 Yes Historical Provider, MD  ondansetron (ZOFRAN) 4 MG tablet Take 4-8 mg by mouth every 6 (six) hours as needed for nausea. 07/21/12  Yes Alison Murray, MD  oxyCODONE (OXY IR/ROXICODONE) 5 MG immediate release tablet Take 5-10 mg by mouth every 4 (four) hours as needed for pain. Take 1-2 tablets (5-10 mg total) by mouth every 4 (four) hours as needed for Pain (1 for mild pain 2 for moderate). 10/05/12  Yes Historical Provider, MD  potassium chloride SA (K-DUR,KLOR-CON) 20 MEQ tablet Take 1 tablet by mouth every morning. Take by mouth 2 times daily. Take 20 mEq by mouth daily. 06/29/12  Yes Historical Provider, MD  pseudoephedrine-acetaminophen (TYLENOL SINUS) 30-500 MG TABS Take 2 tablets by mouth every 6 (six) hours as needed (congestion).   Yes Historical Provider, MD  loperamide (IMODIUM) 2 MG capsule Take 1 capsule (2 mg total) by mouth 4 (four) times daily as needed for diarrhea or loose  stools (or as directed for irinotecan diarrhea). 07/21/12   Alison Murray, MD  prochlorperazine (COMPAZINE) 10 MG tablet Take 10 mg by mouth every 6 (six) hours as needed (nausea). 07/21/12   Alison Murray, MD   Physical Exam: Blood pressure 113/66, pulse 114, temperature 98.9 F (37.2 C), temperature source Oral, resp. rate 14, SpO2 99.00%. Filed Vitals:   10/16/12 0233 10/16/12 0711  BP: 137/74 113/66  Pulse: 154 114  Temp: 98.9 F (37.2 C)   TempSrc: Oral   Resp: 21 14  SpO2: 100% 99%     General:  Moderately built and thinly nourished female patient, lying supine on the gurney with intermittent painful distress.  Eyes: Pupils equally reacting to light and accommodation.  ENT: Oral mucosa is dry.  Neck: Supple. No JVD, carotid bruit or thyromegaly.  Cardiovascular: S1 and S2 heard, regular tachycardic. No JVD, murmurs, gallops, clicks or pedal edema.  Respiratory: Clear to auscultation. No increased work of breathing.  Abdomen: Midline scar extending from the sternum 2 months pubis. Approximately 0.5 cm site in the mid incision (just above the umbilicus) seems to be open with tiny amount of slough otherwise rest of the incision seems to be healing well. Left Hartmann's pouch with minimal liquid green stools. Diffuse tenderness but especially in the right lower quadrant with guarding but no rigidity, rebound. Normal bowel sounds heard.  Skin: No other acute findings.  Musculoskeletal: No acute findings.  Psychiatric: Pleasant and cooperative.  Neurologic: Alert and oriented x3. No focal neurological deficits.  Extremities: Symmetric 5 x 5 power. Patient has a Port-A-Cath in the left upper arm-the site is clean, dry and intact.  Labs  on Admission:  Basic Metabolic Panel:  Recent Labs Lab 10/16/12 0253  NA 125*  K 3.7  CL 87*  CO2 25  GLUCOSE 109*  BUN 11  CREATININE 0.52  CALCIUM 9.3   Liver Function Tests:  Recent Labs Lab 10/16/12 0253  AST 15  ALT 11   ALKPHOS 185*  BILITOT 0.3  PROT 7.2  ALBUMIN 2.3*    Recent Labs Lab 10/16/12 0305  LIPASE 29   No results found for this basename: AMMONIA,  in the last 168 hours CBC:  Recent Labs Lab 10/16/12 0253  WBC 28.3*  NEUTROABS 25.8*  HGB 10.2*  HCT 32.5*  MCV 84.0  PLT 801*   Cardiac Enzymes: No results found for this basename: CKTOTAL, CKMB, CKMBINDEX, TROPONINI,  in the last 168 hours BNP: No components found with this basename: POCBNP,  CBG: No results found for this basename: GLUCAP,  in the last 168 hours  Radiological Exams on Admission: Ct Abdomen Pelvis W Contrast  10/16/2012   *RADIOLOGY REPORT*  Clinical Data: Nausea and vomiting.  Abdominal pain.  CT ABDOMEN AND PELVIS WITH CONTRAST  Technique:  Multidetector CT imaging of the abdomen and pelvis was performed following the standard protocol during bolus administration of intravenous contrast.  Contrast: OMNIPAQUE IOHEXOL 300 MG/ML  SOLN  Comparison: None.  Findings: Lung Bases: Mild dependent atelectasis.  Liver:  Low attenuation adjacent to the falciform ligament of the liver, likely representing fatty infiltration.  Spleen:  Normal.  Gallbladder:  Distended.  Common bile duct:  Normal.  Pancreas:  Pancreas itself appears within normal limits. Ascites in the lesser sac.  Adrenal glands:  Mild thickening of the left adrenal gland, likely hyperplasia.  Kidneys:  Early excretion of contrast from the kidneys is due to a hand injection of contrast.  Low density lesions bilaterally compatible with cysts.  Ureters appear within normal limits.  No hydronephrosis.  Stomach:  Grossly normal.  Small bowel:  Markedly inflamed loops of small bowel present within the anatomic pelvis, with fecalization.  There is marked mural thickening.  This appears to involve jejunum.  Hyperenhancing mucosa is present.  There is no dilation or obstruction identified and the fecalization appears secondary to stasis/ileus.  Moderate volume ascites  is present in the abdomen which is loculated.  There is loculated fluid in the pericolic gutters.  Small gas bubbles are present in the right sided fluid collection. The collection on the right measures 57 mm transverse by 33 mm AP and at least 10.5 cm cranial-caudal.  With small gas bubbles, this likely represent abscess.  There is an enhancing rim around this fluid collection which crosses the midline in the inferior pelvis and extends cranially in the expected location of the left pericolic gutter. There is an anastomotic staple line in the central abdomen to the left of midline which appears represent a small bowel to the colon and anastomosis.  Colon:   Sub total colectomy.  There appears to be a small segment of colon in the left mid abdomen extending to a left-sided colostomy.  Hartmann's pouch with fluid.  Pelvic Genitourinary:  Hysterectomy.  Fick in the urinary bladder compatible with cystitis.  This may be reactive.  Bones:  No aggressive osseous lesions.  Lumbar spondylosis.  Lumbar facet arthrosis.  Vasculature: No acute vascular abnormality.  Atherosclerosis. Pelvic veins opacify normally.  The IVC is somewhat nondistended suggesting low volume status.  Body Wall: Stranding in the anterior abdominal wall compatible with recent surgery.  Subcutaneous injections sites. Left lower quadrant ostomy is present.  IMPRESSION: 1. Markedly inflamed loop of jejunum in the anatomic pelvis. Fecalization is probably secondary to stasis.  No definite obstruction.  This may be reactive inflammation associated with the loculated fluid which probably represents infected fluid/abscess extending from right pericolic gutter through the anatomic pelvis in the left pericolic gutter. 2.  Sub total colectomy with apparent left lower quadrant colostomy.  Hartmann's pouch.   Original Report Authenticated By: Andreas Newport, M.D.      Time spent: 65 minutes.  [facility] Pager [phone]  If  7PM-7AM, please contact night-coverage [email or web address] Password [facility] 10/16/2012, 8:27 AM

## 2012-10-16 NOTE — ED Notes (Signed)
MD at bedside. 

## 2012-10-16 NOTE — ED Notes (Signed)
[facility] notified for transfer to [facility]

## 2012-10-16 NOTE — ED Provider Notes (Signed)
History    CSN: [id number] Arrival date & time 10/16/12  0224  First MD Initiated Contact with Patient 10/16/12 0230     Chief Complaint  Patient presents with  . Emesis   (Consider location/radiation/quality/duration/timing/severity/associated sxs/prior Treatment) Patient is a 67 y.o. female presenting with vomiting. The history is provided by the patient and a relative.  Emesis She is status post radical abdominal surgery for cancer done on June 6 and had been doing reasonably well until this evening when she had sudden increase in her pain severity. Pain went from 4/10-10/10. This is been associated with nausea and vomiting. She has a colostomy which has been functioning well. She had been taking ondansetron for nausea and oxycodone for pain. This has not been effective tonight. Nothing makes the pain better nothing makes it worse. She denies fever, chills, sweats. Past Medical History  Diagnosis Date  . 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 Anemia 08/2011   Past Surgical History  Procedure Laterality Date  . Appendectomy    . Colon resection    . Knee arthroscopy Left   . Portacath placement Left 01/2012    left arm   No family history on file. History  Substance Use Topics  . Smoking status: Never Smoker   . Smokeless tobacco: Not on file  . Alcohol Use: No   OB History   Grav Para Term Preterm Abortions TAB SAB Ect Mult Living                 Review of Systems  Gastrointestinal: Positive for vomiting.  All other systems reviewed and are negative.    Allergies  Ativan and Benadryl  Home Medications   Current Outpatient Rx  Name  Route  Sig  Dispense  Refill  . ALPRAZolam (XANAX) 0.25 MG tablet   Oral   Take 1 tablet (0.25 mg total) by mouth 3 (three) times daily as needed for sleep or anxiety.   30 tablet   0   . dexamethasone (DECADRON) 4 MG tablet      Take 8 mg (2 tablets) once tonight 07/31/12.  Then again take 8 mg (2 tablets)  08/01/12 in the evening.   4 tablet   0   . HYDROcodone-acetaminophen (NORCO/VICODIN) 5-325 MG per tablet   Oral   Take 1-2 tablets by mouth every 4 (four) hours as needed.   100 tablet   0   . loperamide (IMODIUM) 2 MG capsule   Oral   Take 1 capsule (2 mg total) by mouth 4 (four) times daily as needed for diarrhea or loose stools (or as directed for irinotecan diarrhea).   30 capsule   0   . Multiple Vitamin (MULTIVITAMIN WITH MINERALS) TABS   Oral   Take 1 tablet by mouth daily.   30 tablet   0   . ondansetron (ZOFRAN ODT) 8 MG disintegrating tablet   Oral   Take 1 tablet (8 mg total) by mouth every 8 (eight) hours as needed for nausea.   20 tablet   0   . ondansetron (ZOFRAN) 4 MG tablet   Oral   Take 1 tablet (4 mg total) by mouth every 6 (six) hours as needed for nausea.   60 tablet   0   . OxyCODONE (OXYCONTIN) 15 mg T12A   Oral   Take 1 tablet (15 mg total) by mouth every 12 (twelve) hours.   60 tablet   0   . Oxycodone  HCl 10 MG TABS   Oral   Take 1 tablet (10 mg total) by mouth every 4 (four) hours as needed (pain).   60 tablet   0   . pegfilgrastim (NEULASTA) 6 MG/0.6ML injection   Subcutaneous   Inject 6 mg into the skin every 14 (fourteen) days.         . potassium chloride SA (K-DUR,KLOR-CON) 20 MEQ tablet   Oral   Take 20 mEq by mouth daily.         Marland Kitchen PRESCRIPTION MEDICATION      Receiving chemotherapy at [facility] (Dr. Truett Perna).  Regimen is Avastin/FOLFIRI  every 14 days.  Last treatment was 07/12/12, next planned for 07/26/12.         Marland Kitchen prochlorperazine (COMPAZINE) 10 MG tablet   Oral   Take 1 tablet (10 mg total) by mouth every 6 (six) hours as needed.   30 tablet   0   . zolpidem (AMBIEN) 5 MG tablet   Oral   Take 1 tablet (5 mg total) by mouth at bedtime as needed for sleep (insomnia).   30 tablet   0    BP 137/74  Pulse 154  Temp(Src) 98.9 F (37.2 C) (Oral)  Resp 21  SpO2 100% Physical Exam  Nursing note and vitals  reviewed.  67 year old female, who appears uncomfortable, but is in no acute distress. Vital signs are significant for tachycardia with heart rate 154, and borderline tachypnea with respiratory rate of 21. Oxygen saturation is 100%, which is normal. Head is normocephalic and atraumatic. PERRLA, EOMI. Oropharynx is clear. Neck is nontender and supple without adenopathy or JVD. Back is nontender and there is no CVA tenderness. Lungs are clear without rales, wheezes, or rhonchi. Chest is nontender. Heart has regular rate and rhythm without murmur. Abdomen is soft, flat, with marked tenderness throughout. There is no rebound or guarding. Colostomy is present in the left midabdomen. Surgical scars present and healing appropriately without sign of infection. Bowel sounds are markedly decreased. Extremities have no cyanosis or edema, full range of motion is present. Skin is pale, warm, and dry without rash. Neurologic: Mental status is normal, cranial nerves are intact, there are no motor or sensory deficits.  ED Course  Procedures (including critical care time) Results for orders placed during the hospital encounter of 10/16/12  CBC WITH DIFFERENTIAL      Result Value Range   WBC 28.3 (*) 4.0 - 10.5 K/uL   RBC 3.87  3.87 - 5.11 MIL/uL   Hemoglobin 10.2 (*) 12.0 - 15.0 g/dL   HCT 65.7 (*) 84.6 - 96.2 %   MCV 84.0  78.0 - 100.0 fL   MCH 26.4  26.0 - 34.0 pg   MCHC 31.4  30.0 - 36.0 g/dL   RDW 95.2  84.1 - 32.4 %   Platelets 801 (*) 150 - 400 K/uL   Neutrophils Relative % 91 (*) 43 - 77 %   Lymphocytes Relative 3 (*) 12 - 46 %   Monocytes Relative 6  3 - 12 %   Eosinophils Relative 0  0 - 5 %   Basophils Relative 0  0 - 1 %   Neutro Abs 25.8 (*) 1.7 - 7.7 K/uL   Lymphs Abs 0.8  0.7 - 4.0 K/uL   Monocytes Absolute 1.7 (*) 0.1 - 1.0 K/uL   Eosinophils Absolute 0.0  0.0 - 0.7 K/uL   Basophils Absolute 0.0  0.0 - 0.1 K/uL   RBC Morphology  POLYCHROMASIA PRESENT     Smear Review LARGE  PLATELETS PRESENT    COMPREHENSIVE METABOLIC PANEL      Result Value Range   Sodium 125 (*) 135 - 145 mEq/L   Potassium 3.7  3.5 - 5.1 mEq/L   Chloride 87 (*) 96 - 112 mEq/L   CO2 25  19 - 32 mEq/L   Glucose, Bld 109 (*) 70 - 99 mg/dL   BUN 11  6 - 23 mg/dL   Creatinine, Ser 1.91  0.50 - 1.10 mg/dL   Calcium 9.3  8.4 - 47.8 mg/dL   Total Protein 7.2  6.0 - 8.3 g/dL   Albumin 2.3 (*) 3.5 - 5.2 g/dL   AST 15  0 - 37 U/L   ALT 11  0 - 35 U/L   Alkaline Phosphatase 185 (*) 39 - 117 U/L   Total Bilirubin 0.3  0.3 - 1.2 mg/dL   GFR calc non Af Amer >90  >90 mL/min   GFR calc Af Amer >90  >90 mL/min  URINALYSIS, ROUTINE W REFLEX MICROSCOPIC      Result Value Range   Color, Urine YELLOW  YELLOW   APPearance CLOUDY (*) CLEAR   Specific Gravity, Urine >1.046 (*) 1.005 - 1.030   pH 8.0  5.0 - 8.0   Glucose, UA NEGATIVE  NEGATIVE mg/dL   Hgb urine dipstick SMALL (*) NEGATIVE   Bilirubin Urine NEGATIVE  NEGATIVE   Ketones, ur 15 (*) NEGATIVE mg/dL   Protein, ur 30 (*) NEGATIVE mg/dL   Urobilinogen, UA 0.2  0.0 - 1.0 mg/dL   Nitrite POSITIVE (*) NEGATIVE   Leukocytes, UA MODERATE (*) NEGATIVE  LIPASE, BLOOD      Result Value Range   Lipase 29  11 - 59 U/L  URINE MICROSCOPIC-ADD ON      Result Value Range   Squamous Epithelial / LPF RARE  RARE   WBC, UA 21-50  <3 WBC/hpf   RBC / HPF 3-6  <3 RBC/hpf   Bacteria, UA MANY (*) RARE  CG4 I-STAT (LACTIC ACID)      Result Value Range   Lactic Acid, Venous 1.27  0.5 - 2.2 mmol/L   Ct Abdomen Pelvis W Contrast  10/16/2012   *RADIOLOGY REPORT*  Clinical Data: Nausea and vomiting.  Abdominal pain.  CT ABDOMEN AND PELVIS WITH CONTRAST  Technique:  Multidetector CT imaging of the abdomen and pelvis was performed following the standard protocol during bolus administration of intravenous contrast.  Contrast: OMNIPAQUE IOHEXOL 300 MG/ML  SOLN  Comparison: None.  Findings: Lung Bases: Mild dependent atelectasis.  Liver:  Low attenuation adjacent  to the falciform ligament of the liver, likely representing fatty infiltration.  Spleen:  Normal.  Gallbladder:  Distended.  Common bile duct:  Normal.  Pancreas:  Pancreas itself appears within normal limits. Ascites in the lesser sac.  Adrenal glands:  Mild thickening of the left adrenal gland, likely hyperplasia.  Kidneys:  Early excretion of contrast from the kidneys is due to a hand injection of contrast.  Low density lesions bilaterally compatible with cysts.  Ureters appear within normal limits.  No hydronephrosis.  Stomach:  Grossly normal.  Small bowel:  Markedly inflamed loops of small bowel present within the anatomic pelvis, with fecalization.  There is marked mural thickening.  This appears to involve jejunum.  Hyperenhancing mucosa is present.  There is no dilation or obstruction identified and the fecalization appears secondary to stasis/ileus.  Moderate volume ascites is present in  the abdomen which is loculated.  There is loculated fluid in the pericolic gutters.  Small gas bubbles are present in the right sided fluid collection. The collection on the right measures 57 mm transverse by 33 mm AP and at least 10.5 cm cranial-caudal.  With small gas bubbles, this likely represent abscess.  There is an enhancing rim around this fluid collection which crosses the midline in the inferior pelvis and extends cranially in the expected location of the left pericolic gutter. There is an anastomotic staple line in the central abdomen to the left of midline which appears represent a small bowel to the colon and anastomosis.  Colon:   Sub total colectomy.  There appears to be a small segment of colon in the left mid abdomen extending to a left-sided colostomy.  Hartmann's pouch with fluid.  Pelvic Genitourinary:  Hysterectomy.  Fick in the urinary bladder compatible with cystitis.  This may be reactive.  Bones:  No aggressive osseous lesions.  Lumbar spondylosis.  Lumbar facet arthrosis.  Vasculature: No acute  vascular abnormality.  Atherosclerosis. Pelvic veins opacify normally.  The IVC is somewhat nondistended suggesting low volume status.  Body Wall: Stranding in the anterior abdominal wall compatible with recent surgery.  Subcutaneous injections sites. Left lower quadrant ostomy is present.  IMPRESSION: 1. Markedly inflamed loop of jejunum in the anatomic pelvis. Fecalization is probably secondary to stasis.  No definite obstruction.  This may be reactive inflammation associated with the loculated fluid which probably represents infected fluid/abscess extending from right pericolic gutter through the anatomic pelvis in the left pericolic gutter. 2.  Sub total colectomy with apparent left lower quadrant colostomy.  Hartmann's pouch.   Original Report Authenticated By: Andreas Newport, M.D.   Images viewed by me.   Date: 10/16/2012  Rate: 148  Rhythm: sinus tachycardia  QRS Axis: normal  Intervals: normal  ST/T Wave abnormalities: nonspecific ST/T changes  Conduction Disutrbances:none  Narrative Interpretation: Sinus tachycardia, nonspecific ST and T changes which are probably rate related. No prior ECG available for comparison.  Old EKG Reviewed: none available  1. Postoperative intra-abdominal abscess, initial encounter   2. Leukocytosis   3. Thrombocytosis   4. Urinary tract infection   5. Anemia   6. Hyponatremia     MDM  Abdominal pain in patient who is 3.5 weeks postop major abdominal surgery. Because of tachycardia, she'll be given IV fluids and lactic acid level be checked. She will be sent for CT scan to evaluate what is going on in her abdomen. Old records are reviewed and she had been getting chemotherapy for carcinoma of the cecum. There are no notes since her hospitalization at [facility] for surgery.  After initial tachycardia, heart rate has come down with aggressive IV hydration. Pain was controlled with hydromorphone although she has required several doses. CT has a major  finding of intra-abdominal abscess. Urinalysis is come back with positive nitrite indicating UTI. She is started on antibiotics of Zosyn and vancomycin. I have discussed the case with Dr. Waymon Amato of [facility] who is willing to admit the patient but wishes to general surgery to be available. There is concerned since her surgery is recent and was done at [facility]. At this time, I am awaiting call from [facility] [place] [facility] to see if they would be willing to see the patient here. Patient has expressed desire to be treated here if possible. Case is signed out to Dr. Lynelle Doctor.  Dione Booze, MD 10/16/12 [phone]

## 2012-10-17 LAB — URINE CULTURE

## 2012-10-18 NOTE — ED Notes (Signed)
Copy of labs faxed to [facility]. Patient is in room 917

## 2012-11-02 ENCOUNTER — Other Ambulatory Visit: Payer: Self-pay | Admitting: *Deleted

## 2012-11-03 ENCOUNTER — Telehealth: Payer: Self-pay | Admitting: Oncology

## 2012-11-03 NOTE — Telephone Encounter (Signed)
S/w the pt's family member regarding the appt for July. Per the [place] beach and will not be available until aug. R/s the appt to aug at the family's request.

## 2012-11-08 ENCOUNTER — Ambulatory Visit: Payer: Medicare Other | Admitting: Nurse Practitioner

## 2012-11-21 ENCOUNTER — Telehealth: Payer: Self-pay | Admitting: *Deleted

## 2012-11-21 MED ORDER — ALPRAZOLAM 0.25 MG PO TABS: 0.2500 mg | ORAL_TABLET | Freq: Every evening | ORAL | Status: DC | PRN

## 2012-11-21 NOTE — Telephone Encounter (Signed)
Call from pt's friend Valentina Gu requesting [place] [facility] [place]. Pt is currently out of town, having difficulty sleeping and restless legs at night. Alprazolam worked in the past. Called pt, she expressed no other needs, next appt confirmed. Rx sent per Dr. Truett Perna.

## 2012-11-28 ENCOUNTER — Ambulatory Visit (HOSPITAL_BASED_OUTPATIENT_CLINIC_OR_DEPARTMENT_OTHER): Payer: Medicare Other | Admitting: Lab

## 2012-11-28 ENCOUNTER — Ambulatory Visit: Payer: Medicare Other

## 2012-11-28 ENCOUNTER — Telehealth: Payer: Self-pay | Admitting: Oncology

## 2012-11-28 ENCOUNTER — Ambulatory Visit (HOSPITAL_BASED_OUTPATIENT_CLINIC_OR_DEPARTMENT_OTHER): Payer: Medicare Other | Admitting: Nurse Practitioner

## 2012-11-28 VITALS — BP 113/71 | HR 108 | Temp 98.2°F | Resp 19 | Ht 63.0 in | Wt 110.1 lb

## 2012-11-28 DIAGNOSIS — C800 Disseminated malignant neoplasm, unspecified: Secondary | ICD-10-CM

## 2012-11-28 DIAGNOSIS — D649 Anemia, unspecified: Secondary | ICD-10-CM

## 2012-11-28 DIAGNOSIS — C189 Malignant neoplasm of colon, unspecified: Secondary | ICD-10-CM

## 2012-11-28 DIAGNOSIS — C50919 Malignant neoplasm of unspecified site of unspecified female breast: Secondary | ICD-10-CM

## 2012-11-28 DIAGNOSIS — R634 Abnormal weight loss: Secondary | ICD-10-CM

## 2012-11-28 DIAGNOSIS — R109 Unspecified abdominal pain: Secondary | ICD-10-CM

## 2012-11-28 DIAGNOSIS — C779 Secondary and unspecified malignant neoplasm of lymph node, unspecified: Secondary | ICD-10-CM

## 2012-11-28 LAB — CBC WITH DIFFERENTIAL/PLATELET
Basophils Absolute: 0 10*3/uL (ref 0.0–0.1)
EOS%: 4.1 % (ref 0.0–7.0)
Eosinophils Absolute: 0.3 10*3/uL (ref 0.0–0.5)
HCT: 34 % — ABNORMAL LOW (ref 34.8–46.6)
HGB: 10.5 g/dL — ABNORMAL LOW (ref 11.6–15.9)
MCH: 24.1 pg — ABNORMAL LOW (ref 25.1–34.0)
MCV: 78 fL — ABNORMAL LOW (ref 79.5–101.0)
MONO%: 9 % (ref 0.0–14.0)
NEUT%: 59 % (ref 38.4–76.8)
lymph#: 1.8 10*3/uL (ref 0.9–3.3)

## 2012-11-28 LAB — COMPREHENSIVE METABOLIC PANEL (CC13)
Albumin: 2.7 g/dL — ABNORMAL LOW (ref 3.5–5.0)
Alkaline Phosphatase: 174 U/L — ABNORMAL HIGH (ref 40–150)
BUN: 17.5 mg/dL (ref 7.0–26.0)
CO2: 28 mEq/L (ref 22–29)
Glucose: 97 mg/dl (ref 70–140)
Potassium: 3.9 mEq/L (ref 3.5–5.1)

## 2012-11-28 MED ORDER — OXYCODONE HCL 5 MG PO TABS: 5.0000 mg | ORAL_TABLET | ORAL | Status: DC | PRN

## 2012-11-28 MED ORDER — HEPARIN SOD (PORK) LOCK FLUSH 100 UNIT/ML IV SOLN
500.0000 [IU] | Freq: Once | INTRAVENOUS | Status: AC
  Administered 2012-11-28: 500 [IU] via INTRAVENOUS
  Filled 2012-11-28: qty 5

## 2012-11-28 MED ORDER — HYDROCODONE-ACETAMINOPHEN 5-325 MG PO TABS: 1.0000 | ORAL_TABLET | Freq: Four times a day (QID) | ORAL | Status: DC | PRN

## 2012-11-28 MED ORDER — OXYCODONE HCL 5 MG PO TABS: 5.0000 mg | ORAL_TABLET | Freq: Four times a day (QID) | ORAL | Status: DC | PRN

## 2012-11-28 MED ORDER — ONDANSETRON 4 MG PO TBDP: 4.0000 mg | ORAL_TABLET | Freq: Three times a day (TID) | ORAL | Status: DC | PRN

## 2012-11-28 MED ORDER — SODIUM CHLORIDE 0.9 % IJ SOLN
10.0000 mL | INTRAMUSCULAR | Status: DC | PRN
  Administered 2012-11-28: 10 mL via INTRAVENOUS
  Filled 2012-11-28: qty 10

## 2012-11-28 NOTE — Progress Notes (Addendum)
OFFICE PROGRESS NOTE  Interval history:  Kristen Alexander underwent cytoreductive surgery and HIPEC on 09/25/2012. Pathology as follows: Pelvic stripping poorly differentiated adenocarcinoma signet ring features; round ligament of the liver no malignancy identified; left gutter stripping poorly differentiated adenocarcinoma signet ring features; uterus, bilateral fallopian tubes and ovaries, small bowel, right bowel en block resection with poorly differentiated adenocarcinoma with signet ring features invading through muscularis propria into surrounding adipose tissue and present multifocally at serosal surface. Intestinal resection margins (x4) negative for carcinoma. 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 27 of 30 lymph nodes involved by carcinoma, extracapsular extension present. Multiple deposits of carcinoma within surrounding adipose tissue. Anastomosis site with mucosal erosion. Endometrial polyp and serosal adhesions not involved by malignancy. Unremarkable fallopian tubes.  She was readmitted 10/16/2012 with abdominal pain. A right lower quadrant fluid collection was identified. She underwent percutaneous drainage and was discharged home with a drain.  She feels she is slowly recovering from surgery. Appetite is beginning to improve. Energy level is also beginning to improve. She has watery output from the colostomy. She notes improvement in the output when she takes Lomotil. She is not taking Lomotil on a schedule. She has intermittent abdominal cramps. She takes hydrocodone for mild pain and oxycodone for more severe pain. She has intermittent nausea.   Objective: Blood pressure 113/71, pulse 108, temperature 98.2 F (36.8 C), temperature source Oral, resp. rate 19, height 5\' 3"  (1.6 m), weight 110 lb 1.6 oz (49.941 kg).  Chronically ill-appearing female in no acute distress. Lungs are clear. Regular cardiac rhythm. Left upper extremity port site is without erythema. Left abdomen colostomy. Liquid  stool in the collection bag. No hepatomegaly. Abdomen feels somewhat firm at the right mid abdomen as compared to the left side. No discrete mass. Extremities without edema.  Lab Results: Lab Results  Component Value Date   WBC 28.3* 10/16/2012   HGB 10.2* 10/16/2012   HCT 32.5* 10/16/2012   MCV 84.0 10/16/2012   PLT 801* 10/16/2012    Chemistry:    Chemistry      Component Value Date/Time   NA 125* 10/16/2012 0253   NA 141 08/23/2012 0833   K 3.7 10/16/2012 0253   K 3.8 08/23/2012 0833   CL 87* 10/16/2012 0253   CL 106 08/23/2012 0833   CO2 25 10/16/2012 0253   CO2 25 08/23/2012 0833   BUN 11 10/16/2012 0253   BUN 6.9* 08/23/2012 0833   CREATININE 0.52 10/16/2012 0253   CREATININE 0.7 08/23/2012 0833      Component Value Date/Time   CALCIUM 9.3 10/16/2012 0253   CALCIUM 9.2 08/23/2012 0833   ALKPHOS 185* 10/16/2012 0253   ALKPHOS 92 08/23/2012 0833   AST 15 10/16/2012 0253   AST 16 08/23/2012 0833   ALT 11 10/16/2012 0253   ALT 11 08/23/2012 0833   BILITOT 0.3 10/16/2012 0253   BILITOT 0.29 08/23/2012 0833       Studies/Results: No results found.  Medications: I have reviewed the patient's current medications.  Assessment/Plan:  1. Extended right colectomy on 09/17/2011 with pathology confirming a T4, N2, M1 tumor. 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 Status post FOLFOX (3 cycles) with progressive nausea and weight loss. Chemotherapy switched to FOLFIRI (2 cycles). Admission with a bowel obstruction December 2013 status post jejuno-colic anastomosis with resolution of obstructive symptoms, progressive carcinomatosis noted at the time of surgery. Status post FOLFIRI/Avastin cycle 1 on 06/14/2012. Cycle 4 on 07/26/2012. Status post cytoreductive surgery and HIPEC  09/25/2012.R1 resection with intraperitoneal mitomycin-C. Pathology as follows: Pelvic stripping poorly differentiated adenocarcinoma signet ring features; round ligament of the liver no malignancy identified; left  gutter stripping poorly differentiated adenocarcinoma signet ring features; uterus, bilateral fallopian tubes and ovaries, small bowel, right bowel en block resection with poorly differentiated adenocarcinoma with signet ring features invading through muscularis propria into surrounding adipose tissue and present multifocally at serosal surface. Intestinal resection margins (x4) negative for carcinoma. 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 27 of 30 lymph nodes involved by carcinoma, extracapsular extension present. Multiple deposits of carcinoma within surrounding adipose tissue. Anastomosis site with mucosal erosion. Endometrial polyp and serosal adhesions not involved by malignancy. Unremarkable fallopian tubes. 2. History of a microcytic anemia. Likely iron deficiency. 3. Weight loss secondary to progressive metastatic colon cancer with a bowel obstruction.and cytoreductive surgery 4. Left arm passport. 5. Crampy abdominal pain during the irinotecan infusion cycle 2 relieved with atropine. 6. Diarrhea following cycle 2 FOLFIRI/Avastin. The diarrhea was controlled with Imodium. 7. Admission 07/19/2012 with intractable nausea/vomiting 8. Nausea/vomiting/diarrhea beginning on day 4 following the FOLFIRI/Avastin given on 07/26/2012-likely irinotecan-induced nausea and enteritis 9. Admission with febrile neutropenia and an abdominal incision abscess/fistula 08/06/2012, the abdominal wound is open with no drainage at present. 10. Hospitalization at [facility] 10/16/2012 for abdominal pain. Found to have a right lower quadrant fluid collection status post percutaneous drainage. She was discharged home with a drain. The drain has been removed.  Disposition-Ms. Takeda has a poor performance status. She has lost a significant amount of weight since her last visit here in May of this year. Dr. Truett Perna does not feel she is a candidate for chemotherapy given her current performance status. An observation approach was  recommended. She is in agreement.  Passport will be flushed while she is in the office today. We are obtaining labs to include CBC, chemistry panel and CEA. She will return for a followup visit and port flush in 6 weeks. She will contact the office in the interim with any problems.  She was given prescriptions for hydrocodone, oxycodone and Zofran at today's visit.  Patient seen with Dr. Truett Perna.  Lonna Cobb ANP/GNP-BC  This was assured visit with Lonna Cobb. Ms. Hamstra is interviewed and examined.  She continues to recover from the HIPEC surgery.She had progressive carcinomatosis despite FOLFOX and FOLFIRI/Avastin. There is no clear indication for "adjuvant" systemic therapy and her performance status remains borderline. I do not recommend further chemotherapy unless she develops clinical evidence of disease progression. We will check the K-ras status of her tumor.  Ms. Knudsen will return for an office visit and Port-A-Cath flush in 6 weeks. She is comfortable with an observation approach. Hopefully the R1 resection and intraperitoneal mitomycin-C will achieve a long-term clinical remission.  Mancel Bale, M.D.

## 2012-11-28 NOTE — Telephone Encounter (Signed)
gv and printed appt sched and avs for pt  °

## 2012-11-29 LAB — CEA: CEA: 0.8 ng/mL (ref 0.0–5.0)

## 2012-12-22 ENCOUNTER — Telehealth: Payer: Self-pay | Admitting: *Deleted

## 2012-12-22 DIAGNOSIS — C800 Disseminated malignant neoplasm, unspecified: Secondary | ICD-10-CM

## 2012-12-22 DIAGNOSIS — C189 Malignant neoplasm of colon, unspecified: Secondary | ICD-10-CM

## 2012-12-22 MED ORDER — OXYCODONE HCL 5 MG PO TABS: 5.0000 mg | ORAL_TABLET | Freq: Four times a day (QID) | ORAL | Status: DC | PRN

## 2012-12-22 MED ORDER — ALPRAZOLAM 0.25 MG PO TABS: 0.2500 mg | ORAL_TABLET | Freq: Every evening | ORAL | Status: DC | PRN

## 2012-12-22 MED ORDER — HYDROCODONE-ACETAMINOPHEN 5-325 MG PO TABS: 1.0000 | ORAL_TABLET | Freq: Four times a day (QID) | ORAL | Status: DC | PRN

## 2012-12-22 NOTE — Telephone Encounter (Signed)
Call from [facility], friend of pt, requesting refills on pain medications, Xanax and Lomotil. Returned call to pt, who is currently at [facility]-- she reports she has enough Lomotil. Still taking Hydrocodone for pain, takes Oxycodone if pain is severe. Reviewed with Dr. Truett Perna: Rx left in prescription book.

## 2013-01-05 ENCOUNTER — Ambulatory Visit (HOSPITAL_BASED_OUTPATIENT_CLINIC_OR_DEPARTMENT_OTHER): Payer: Medicare Other

## 2013-01-05 ENCOUNTER — Telehealth: Payer: Self-pay | Admitting: Oncology

## 2013-01-05 ENCOUNTER — Other Ambulatory Visit (HOSPITAL_BASED_OUTPATIENT_CLINIC_OR_DEPARTMENT_OTHER): Payer: Medicare Other | Admitting: Lab

## 2013-01-05 ENCOUNTER — Ambulatory Visit (HOSPITAL_BASED_OUTPATIENT_CLINIC_OR_DEPARTMENT_OTHER): Payer: Medicare Other | Admitting: Oncology

## 2013-01-05 VITALS — BP 118/77 | HR 94 | Temp 97.5°F | Resp 20 | Ht 63.0 in | Wt 109.2 lb

## 2013-01-05 DIAGNOSIS — C189 Malignant neoplasm of colon, unspecified: Secondary | ICD-10-CM

## 2013-01-05 DIAGNOSIS — Z452 Encounter for adjustment and management of vascular access device: Secondary | ICD-10-CM

## 2013-01-05 DIAGNOSIS — D126 Benign neoplasm of colon, unspecified: Secondary | ICD-10-CM

## 2013-01-05 DIAGNOSIS — C800 Disseminated malignant neoplasm, unspecified: Secondary | ICD-10-CM

## 2013-01-05 LAB — COMPREHENSIVE METABOLIC PANEL (CC13)
ALT: 67 U/L — ABNORMAL HIGH (ref 0–55)
AST: 59 U/L — ABNORMAL HIGH (ref 5–34)
Albumin: 3 g/dL — ABNORMAL LOW (ref 3.5–5.0)
Alkaline Phosphatase: 171 U/L — ABNORMAL HIGH (ref 40–150)
Calcium: 7.6 mg/dL — ABNORMAL LOW (ref 8.4–10.4)
Chloride: 101 mEq/L (ref 98–109)
Potassium: 3.4 mEq/L — ABNORMAL LOW (ref 3.5–5.1)
Sodium: 141 mEq/L (ref 136–145)
Total Protein: 7.2 g/dL (ref 6.4–8.3)

## 2013-01-05 LAB — CBC WITH DIFFERENTIAL/PLATELET
BASO%: 0.7 % (ref 0.0–2.0)
EOS%: 10.2 % — ABNORMAL HIGH (ref 0.0–7.0)
HCT: 34.2 % — ABNORMAL LOW (ref 34.8–46.6)
MCH: 24.4 pg — ABNORMAL LOW (ref 25.1–34.0)
MCHC: 31.5 g/dL (ref 31.5–36.0)
MONO#: 0.7 10*3/uL (ref 0.1–0.9)
NEUT%: 57.8 % (ref 38.4–76.8)
RBC: 4.42 10*6/uL (ref 3.70–5.45)
RDW: 20.8 % — ABNORMAL HIGH (ref 11.2–14.5)
WBC: 7.4 10*3/uL (ref 3.9–10.3)
lymph#: 1.6 10*3/uL (ref 0.9–3.3)

## 2013-01-05 LAB — UA PROTEIN, DIPSTICK - CHCC: Protein, ur: 30 mg/dL

## 2013-01-05 MED ORDER — HEPARIN SOD (PORK) LOCK FLUSH 100 UNIT/ML IV SOLN
500.0000 [IU] | Freq: Once | INTRAVENOUS | Status: AC
  Administered 2013-01-05: 500 [IU] via INTRAVENOUS
  Filled 2013-01-05: qty 5

## 2013-01-05 MED ORDER — SODIUM CHLORIDE 0.9 % IJ SOLN
10.0000 mL | INTRAMUSCULAR | Status: DC | PRN
  Administered 2013-01-05: 10 mL via INTRAVENOUS
  Filled 2013-01-05: qty 10

## 2013-01-05 MED ORDER — ALPRAZOLAM 0.5 MG PO TABS: 0.5000 mg | ORAL_TABLET | Freq: Every evening | ORAL | Status: DC | PRN

## 2013-01-05 MED ORDER — HYDROCODONE-ACETAMINOPHEN 5-325 MG PO TABS: 1.0000 | ORAL_TABLET | Freq: Four times a day (QID) | ORAL | Status: DC | PRN

## 2013-01-05 MED ORDER — OXYCODONE HCL 5 MG PO TABS: 5.0000 mg | ORAL_TABLET | Freq: Four times a day (QID) | ORAL | Status: DC | PRN

## 2013-01-05 MED ORDER — DIPHENOXYLATE-ATROPINE 2.5-0.025 MG PO TABS: 1.0000 | ORAL_TABLET | Freq: Four times a day (QID) | ORAL | Status: DC | PRN

## 2013-01-05 NOTE — Progress Notes (Signed)
[facility]    OFFICE PROGRESS NOTE   INTERVAL HISTORY:   She returns as scheduled. She reports a good appetite. Intermittent abdominal cramps after eating. The pain is relieved with hydrocodone and oxycodone. She had one episode of nausea and vomiting 2 days ago. She empties the ostomy 7-11 times per day. Lomotil slows the ostomy output.  Objective:  Vital signs in last 24 hours:  Blood pressure 118/77, pulse 94, temperature 97.5 F (36.4 C), temperature source Oral, resp. rate 20, height 5\' 3"  (1.6 m), weight 109 lb 3.2 oz (49.533 kg).    HEENT: No thrush Resp: Lungs clear bilaterally Cardio: Regular rate and rhythm GI: Liquid stool in the left abdominal ostomy bag, the abdomen is nontender, no hepatomegaly, no apparent ascites, firmness throughout the right abdomen, nontender, healed midline incision Vascular: No leg edema  Portacath/PICC-without erythema  Lab Results:  Lab Results  Component Value Date   WBC 7.4 01/05/2013   HGB 10.8* 01/05/2013   HCT 34.2* 01/05/2013   MCV 77.4* 01/05/2013   PLT 340 01/05/2013   ANC 4.3    Medications: I have reviewed the patient's current medications.  Assessment/Plan: 1. Extended right colectomy on 09/17/2011 with pathology confirming a T4, N2, M1 tumor. 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 Status post FOLFOX (3 cycles) with progressive nausea and weight loss. Chemotherapy switched to FOLFIRI (2 cycles). Admission with a bowel obstruction December 2013 status post jejuno-colic anastomosis with resolution of obstructive symptoms, progressive carcinomatosis noted at the time of surgery. Status post FOLFIRI/Avastin cycle 1 on 06/14/2012. Cycle 4 on 07/26/2012. Status post cytoreductive surgery and HIPEC with creation of an end descending colostomy 09/25/2012.R1 resection with intraperitoneal mitomycin-C. Pathology as follows: Pelvic stripping poorly differentiated adenocarcinoma signet ring  features; round ligament of the liver no malignancy identified; left gutter stripping poorly differentiated adenocarcinoma signet ring features; uterus, bilateral fallopian tubes and ovaries, small bowel, right bowel en block resection with poorly differentiated adenocarcinoma with signet ring features invading through muscularis propria into surrounding adipose tissue and present multifocally at serosal surface. Intestinal resection margins (x4) negative for carcinoma. 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 27 of 30 lymph nodes involved by carcinoma, extracapsular extension present. Multiple deposits of carcinoma within surrounding adipose tissue. Anastomosis site with mucosal erosion. Endometrial polyp and serosal adhesions not involved by malignancy. Unremarkable fallopian tubes. 2. History of a microcytic anemia. Likely iron deficiency. 3. Weight loss secondary to progressive metastatic colon cancer with a bowel obstruction.and cytoreductive surgery 4. Left arm passport. 5. Crampy abdominal pain during the irinotecan infusion cycle 2 relieved with atropine. 6. Diarrhea following cycle 2 FOLFIRI/Avastin. The diarrhea was controlled with Imodium. 7. Admission 07/19/2012 with intractable nausea/vomiting 8. Nausea/vomiting/diarrhea beginning on day 4 following the FOLFIRI/Avastin given on 07/26/2012-likely irinotecan-induced nausea and enteritis 9. Admission with febrile neutropenia and an abdominal incision abscess/fistula 08/06/2012, the abdominal wound is open with no drainage at present. 10. Hospitalization at [facility] 10/16/2012 for abdominal pain. Found to have a right lower quadrant fluid collection status post percutaneous drainage. She was discharged home with a drain. The drain has been removed.   Disposition:  Ms. Diehl appears to be slowly declining. She will continue the current pain regimen. She will use Lomotil for the high output ostomy. She requested a change in the sleep medication. We  increased the Xanax dose to 0.5-1 mg each night when necessary. The Port-A-Cath was flushed today. She will return for an office visit and Port-A-Cath flush in  6 weeks.  We discussed the indication for further chemotherapy. She does not wish to receive further chemotherapy and agrees with a supportive care approach.     Thornton Papas, MD  01/05/2013  4:44 PM

## 2013-01-05 NOTE — Patient Instructions (Addendum)

## 2013-01-05 NOTE — Telephone Encounter (Signed)
Gave pt appt for MD and flush on October 2014

## 2013-01-06 LAB — CEA: CEA: 1.6 ng/mL (ref 0.0–5.0)

## 2013-01-29 ENCOUNTER — Other Ambulatory Visit: Payer: Self-pay | Admitting: Nurse Practitioner

## 2013-02-06 ENCOUNTER — Other Ambulatory Visit: Payer: Self-pay | Admitting: *Deleted

## 2013-02-06 DIAGNOSIS — C189 Malignant neoplasm of colon, unspecified: Secondary | ICD-10-CM

## 2013-02-06 DIAGNOSIS — C800 Disseminated malignant neoplasm, unspecified: Secondary | ICD-10-CM

## 2013-02-06 MED ORDER — HYDROCODONE-ACETAMINOPHEN 5-325 MG PO TABS: 1.0000 | ORAL_TABLET | Freq: Four times a day (QID) | ORAL | Status: DC | PRN

## 2013-02-06 MED ORDER — OXYCODONE HCL 5 MG PO TABS: 5.0000 mg | ORAL_TABLET | Freq: Four times a day (QID) | ORAL | Status: DC | PRN

## 2013-02-15 ENCOUNTER — Ambulatory Visit (HOSPITAL_BASED_OUTPATIENT_CLINIC_OR_DEPARTMENT_OTHER): Payer: Medicare Other | Admitting: Oncology

## 2013-02-15 ENCOUNTER — Ambulatory Visit (HOSPITAL_BASED_OUTPATIENT_CLINIC_OR_DEPARTMENT_OTHER): Payer: Medicare Other

## 2013-02-15 ENCOUNTER — Telehealth: Payer: Self-pay | Admitting: Oncology

## 2013-02-15 ENCOUNTER — Ambulatory Visit: Payer: Medicare Other | Admitting: Nutrition

## 2013-02-15 VITALS — BP 135/79 | HR 103 | Temp 98.0°F | Resp 18 | Ht 63.0 in | Wt 107.8 lb

## 2013-02-15 DIAGNOSIS — R112 Nausea with vomiting, unspecified: Secondary | ICD-10-CM

## 2013-02-15 DIAGNOSIS — R197 Diarrhea, unspecified: Secondary | ICD-10-CM

## 2013-02-15 DIAGNOSIS — C189 Malignant neoplasm of colon, unspecified: Secondary | ICD-10-CM

## 2013-02-15 DIAGNOSIS — C50919 Malignant neoplasm of unspecified site of unspecified female breast: Secondary | ICD-10-CM

## 2013-02-15 DIAGNOSIS — Z23 Encounter for immunization: Secondary | ICD-10-CM

## 2013-02-15 DIAGNOSIS — C800 Disseminated malignant neoplasm, unspecified: Secondary | ICD-10-CM

## 2013-02-15 MED ORDER — INFLUENZA VAC SPLIT QUAD 0.5 ML IM SUSP
0.5000 mL | Freq: Once | INTRAMUSCULAR | Status: DC
  Administered 2013-02-15: 0.5 mL via INTRAMUSCULAR
  Filled 2013-02-15: qty 0.5

## 2013-02-15 MED ORDER — OXYCODONE HCL 5 MG PO TABS: 5.0000 mg | ORAL_TABLET | Freq: Four times a day (QID) | ORAL | Status: DC | PRN

## 2013-02-15 MED ORDER — ALPRAZOLAM 0.5 MG PO TABS: 0.5000 mg | ORAL_TABLET | Freq: Every evening | ORAL | Status: DC | PRN

## 2013-02-15 MED ORDER — HYDROCODONE-ACETAMINOPHEN 5-325 MG PO TABS: 1.0000 | ORAL_TABLET | Freq: Four times a day (QID) | ORAL | Status: DC | PRN

## 2013-02-15 MED ORDER — SODIUM CHLORIDE 0.9 % IJ SOLN
10.0000 mL | INTRAMUSCULAR | Status: DC | PRN
  Administered 2013-02-15: 10 mL via INTRAVENOUS
  Filled 2013-02-15: qty 10

## 2013-02-15 MED ORDER — HEPARIN SOD (PORK) LOCK FLUSH 100 UNIT/ML IV SOLN
500.0000 [IU] | Freq: Once | INTRAVENOUS | Status: AC
  Administered 2013-02-15: 500 [IU] via INTRAVENOUS
  Filled 2013-02-15: qty 5

## 2013-02-15 NOTE — Telephone Encounter (Signed)
gv and printed appt sched and avs for pt for Dec..Marland KitchenMarland KitchenPer Dr. Truett Perna use 11:15slot

## 2013-02-15 NOTE — Progress Notes (Signed)
The patient is a 67 year old female diagnosed with colon cancer with an extended right colectomy.  Patient is seeking supportive care.  Past medical history includes microcytic anemia and bowel obstruction.  Medications include Xanax, Lomotil, and Zofran.  Labs include potassium of 3.4, glucose 144, and albumin 3.0.  Height: 63 inches. Weight: 107.8 pounds. Usual body weight: 159 pounds February 2014. BMI: 19.1.   Patient reports good appetite.  She states she empties ostomy bag 7-11 times a day.  However, it is not full.  She does not typically use Lomotil, but expresses concern about loose stool.  Nutrition diagnosis: Food and nutrition related knowledge deficit related to diagnosis of colon cancer and associated treatments as evidenced by no prior need for nutrition related information.    Intervention: Patient educated to consume smaller, more frequent meals with increased calories and protein.  Patient encouraged to try Lactaid milk, and lactose free containing foods to see if this helps improve stool output.  She was educated on foods to consume help thicken her stool.  Patient was provided with oral nutrition supplements to try between meals.  Strategies given on how to improve taste.  Fact sheets were provided.  Contact information was given.  Teach back method was used.  Monitoring, evaluation, goals: Patient will tolerate increase calories and protein for improved quality-of-life.  Next visit: Patient will call me with questions or concerns or to schedule followup as needed.

## 2013-02-15 NOTE — Progress Notes (Signed)
[facility]    OFFICE PROGRESS NOTE   INTERVAL HISTORY:   She returns for scheduled followup of metastatic colon cancer. She reports increased nausea for the past 2 weeks. She relates this to sinus drainage. The nausea is relieved with Zofran. She had one episode of emesis yesterday. She reports difficulty with leakage around the ostomy wafer. She is now using a different size wafer. Intermittent abdominal pain.  Objective:  Vital signs in last 24 hours:  Blood pressure 135/79, pulse 103, temperature 98 F (36.7 C), temperature source Oral, resp. rate 18, height 5\' 3"  (1.6 m), weight 107 lb 12.8 oz (48.898 kg), SpO2 100.00%.    HEENT: No thrush Resp: Lungs clear bilaterally Cardio: Regular rate and rhythm GI: No hepatomegaly, no mass. Healed midline incision.; Stool in the left abdomen ostomy bag Vascular: No leg edema  Portacath/PICC-without erythema  Lab Results:  Lab Results  Component Value Date   WBC 7.4 01/05/2013   HGB 10.8* 01/05/2013   HCT 34.2* 01/05/2013   MCV 77.4* 01/05/2013   PLT 340 01/05/2013      Medications: I have reviewed the patient's current medications.  Assessment/Plan: 1. Extended right colectomy on 09/17/2011 with pathology confirming a T4, N2, M1 tumor. 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 Status post FOLFOX (3 cycles) with progressive nausea and weight loss. Chemotherapy switched to FOLFIRI (2 cycles). Admission with a bowel obstruction December 2013 status post jejuno-colic anastomosis with resolution of obstructive symptoms, progressive carcinomatosis noted at the time of surgery. Status post FOLFIRI/Avastin cycle 1 on 06/14/2012. Cycle 4 on 07/26/2012. Status post cytoreductive surgery and HIPEC with creation of an end descending colostomy 09/25/2012.R1 resection with intraperitoneal mitomycin-C. Pathology as follows: Pelvic stripping poorly differentiated adenocarcinoma signet ring features; round  ligament of the liver no malignancy identified; left gutter stripping poorly differentiated adenocarcinoma signet ring features; uterus, bilateral fallopian tubes and ovaries, small bowel, right bowel en block resection with poorly differentiated adenocarcinoma with signet ring features invading through muscularis propria into surrounding adipose tissue and present multifocally at serosal surface. Intestinal resection margins (x4) negative for carcinoma. 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 27 of 30 lymph nodes involved by carcinoma, extracapsular extension present. Multiple deposits of carcinoma within surrounding adipose tissue. Anastomosis site with mucosal erosion. Endometrial polyp and serosal adhesions not involved by malignancy. Unremarkable fallopian tubes. 2. History of a microcytic anemia. Likely iron deficiency. 3. Weight loss secondary to progressive metastatic colon cancer with a bowel obstruction.and cytoreductive surgery 4. Left arm passport. 5. Crampy abdominal pain during the irinotecan infusion cycle 2 relieved with atropine. 6. Diarrhea following cycle 2 FOLFIRI/Avastin. The diarrhea was controlled with Imodium. 7. Admission 07/19/2012 with intractable nausea/vomiting 8. Nausea/vomiting/diarrhea beginning on day 4 following the FOLFIRI/Avastin given on 07/26/2012-likely irinotecan-induced nausea and enteritis 9. Admission with febrile neutropenia and an abdominal incision abscess/fistula 08/06/2012, 10. Hospitalization at [facility] 10/16/2012 for abdominal pain. Found to have a right lower quadrant fluid collection status post percutaneous drainage. She was discharged home with a drain. The drain has been removed.  Disposition:  Her overall status appears unchanged. I recommended she followup with the ostomy nurse for help with the leakage. She will continue the current pain regimen. We flushed the Port-A-Cath and she received an influenza vaccine today. [name] will return for an  office visit and Port-A-Cath flush in 6 weeks. She will contact us in the interim for new symptoms.   Thornton Papas, MD  02/15/2013  10:49 AM

## 2013-02-15 NOTE — Patient Instructions (Signed)
Implanted Port Instructions  An implanted port is a central line that has a round shape and is placed under the skin. It is used for long-term IV (intravenous) access for:  · Medicine.  · Fluids.  · Liquid nutrition, such as TPN (total parenteral nutrition).  · Blood samples.  Ports can be placed:  · In the chest area just below the collarbone (this is the most common place.)  · In the arms.  · In the belly (abdomen) area.  · In the legs.  PARTS OF THE PORT  A port has 2 main parts:  · The reservoir. The reservoir is round, disc-shaped, and will be a small, raised area under your skin.  · The reservoir is the part where a needle is inserted (accessed) to either give medicines or to draw blood.  · The catheter. The catheter is a long, slender tube that extends from the reservoir. The catheter is placed into a large vein.  · Medicine that is inserted into the reservoir goes into the catheter and then into the vein.  INSERTION OF THE PORT  · The port is surgically placed in either an operating room or in a procedural area (interventional radiology).  · Medicine may be given to help you relax during the procedure.  · The skin where the port will be inserted is numbed (local anesthetic).  · 1 or 2 small cuts (incisions) will be made in the skin to insert the port.  · The port can be used after it has been inserted.  INCISION SITE CARE  · The incision site may have small adhesive strips on it. This helps keep the incision site closed. Sometimes, no adhesive strips are placed. Instead of adhesive strips, a special kind of surgical glue is used to keep the incision closed.  · If adhesive strips were placed on the incision sites, do not take them off. They will fall off on their own.  · The incision site may be sore for 1 to 2 days. Pain medicine can help.  · Do not get the incision site wet. Bathe or shower as directed by your caregiver.  · The incision site should heal in 5 to 7 days. A small scar may form after the  incision has healed.  ACCESSING THE PORT  Special steps must be taken to access the port:  · Before the port is accessed, a numbing cream can be placed on the skin. This helps numb the skin over the port site.  · A sterile technique is used to access the port.  · The port is accessed with a needle. Only "non-coring" port needles should be used to access the port. Once the port is accessed, a blood return should be checked. This helps ensure the port is in the vein and is not clogged (clotted).  · If your caregiver believes your port should remain accessed, a clear (transparent) bandage will be placed over the needle site. The bandage and needle will need to be changed every week or as directed by your caregiver.  · Keep the bandage covering the needle clean and dry. Do not get it wet. Follow your caregiver's instructions on how to take a shower or bath when the port is accessed.  · If your port does not need to stay accessed, no bandage is needed over the port.  FLUSHING THE PORT  Flushing the port keeps it from getting clogged. How often the port is flushed depends on:  · If a   constant infusion is running. If a constant infusion is running, the port may not need to be flushed.  · If intermittent medicines are given.  · If the port is not being used.  For intermittent medicines:  · The port will need to be flushed:  · After medicines have been given.  · After blood has been drawn.  · As part of routine maintenance.  · A port is normally flushed with:  · Normal saline.  · Heparin.  · Follow your caregiver's advice on how often, how much, and the type of flush to use on your port.  IMPORTANT PORT INFORMATION  · Tell your caregiver if you are allergic to heparin.  · After your port is placed, you will get a manufacturer's information card. The card has information about your port. Keep this card with you at all times.  · There are many types of ports available. Know what kind of port you have.  · In case of an  emergency, it may be helpful to wear a medical alert bracelet. This can help alert health care workers that you have a port.  · The port can stay in for as long as your caregiver believes it is necessary.  · When it is time for the port to come out, surgery will be done to remove it. The surgery will be similar to how the port was put in.  · If you are in the hospital or clinic:  · Your port will be taken care of and flushed by a nurse.  · If you are at home:  · A home health care nurse may give medicines and take care of the port.  · You or a family member can get special training and directions for giving medicine and taking care of the port at home.  SEEK IMMEDIATE MEDICAL CARE IF:   · Your port does not flush or you are unable to get a blood return.  · New drainage or pus is coming from the incision.  · A bad smell is coming from the incision site.  · You develop swelling or increased redness at the incision site.  · You develop increased swelling or pain at the port site.  · You develop swelling or pain in the surrounding skin near the port.  · You have an oral temperature above 102° F (38.9° C), not controlled by medicine.  MAKE SURE YOU:   · Understand these instructions.  · Will watch your condition.  · Will get help right away if you are not doing well or get worse.  Document Released: 04/05/2005 Document Revised: 06/28/2011 Document Reviewed: 06/27/2008  [employer]® Patient Information ©2014 [employer].

## 2013-02-18 ENCOUNTER — Other Ambulatory Visit: Payer: Self-pay | Admitting: Nurse Practitioner

## 2013-02-18 DIAGNOSIS — C189 Malignant neoplasm of colon, unspecified: Secondary | ICD-10-CM

## 2013-03-12 ENCOUNTER — Telehealth: Payer: Self-pay | Admitting: *Deleted

## 2013-03-12 DIAGNOSIS — C800 Disseminated malignant neoplasm, unspecified: Secondary | ICD-10-CM

## 2013-03-12 DIAGNOSIS — C189 Malignant neoplasm of colon, unspecified: Secondary | ICD-10-CM

## 2013-03-12 MED ORDER — OXYCODONE HCL 5 MG PO TABS: 5.0000 mg | ORAL_TABLET | Freq: Four times a day (QID) | ORAL | Status: DC | PRN

## 2013-03-12 MED ORDER — HYDROCODONE-ACETAMINOPHEN 5-325 MG PO TABS: 1.0000 | ORAL_TABLET | Freq: Four times a day (QID) | ORAL | Status: DC | PRN

## 2013-03-12 NOTE — Telephone Encounter (Signed)
Left VM requesting refills on both pain meds and her nausea med. Reports she needs to pick up scripts by tomorrow due to going out of town tomorrow. Called [facility] and confirmed she still has 4 refills on the ondansetron ODT. They will get next fill ready for her.

## 2013-03-12 NOTE — Telephone Encounter (Signed)
Called patient and she reports she has been having more abdominal pain recently. Also had "GI bug" that she took the pain medications for due to increased abdominal pain. Reports she does have enough to last her to the weekend. LouAnn called because she is leaving [place] tomorrow to arrive there.  OK to refill per MD: She has significant disease and needs it. OK to increase to #75 of each.

## 2013-03-30 ENCOUNTER — Telehealth: Payer: Self-pay | Admitting: Oncology

## 2013-03-30 ENCOUNTER — Ambulatory Visit (HOSPITAL_BASED_OUTPATIENT_CLINIC_OR_DEPARTMENT_OTHER): Payer: Medicare Other

## 2013-03-30 ENCOUNTER — Ambulatory Visit (HOSPITAL_BASED_OUTPATIENT_CLINIC_OR_DEPARTMENT_OTHER): Payer: Medicare Other | Admitting: Oncology

## 2013-03-30 VITALS — BP 99/66 | HR 91 | Temp 97.4°F | Resp 20 | Ht 63.0 in | Wt 105.5 lb

## 2013-03-30 DIAGNOSIS — C772 Secondary and unspecified malignant neoplasm of intra-abdominal lymph nodes: Secondary | ICD-10-CM

## 2013-03-30 DIAGNOSIS — C180 Malignant neoplasm of cecum: Secondary | ICD-10-CM

## 2013-03-30 DIAGNOSIS — C786 Secondary malignant neoplasm of retroperitoneum and peritoneum: Secondary | ICD-10-CM

## 2013-03-30 DIAGNOSIS — C172 Malignant neoplasm of ileum: Secondary | ICD-10-CM

## 2013-03-30 DIAGNOSIS — C800 Disseminated malignant neoplasm, unspecified: Secondary | ICD-10-CM

## 2013-03-30 DIAGNOSIS — Z95828 Presence of other vascular implants and grafts: Secondary | ICD-10-CM

## 2013-03-30 DIAGNOSIS — C189 Malignant neoplasm of colon, unspecified: Secondary | ICD-10-CM

## 2013-03-30 DIAGNOSIS — R634 Abnormal weight loss: Secondary | ICD-10-CM

## 2013-03-30 MED ORDER — DIPHENOXYLATE-ATROPINE 2.5-0.025 MG PO TABS: 1.0000 | ORAL_TABLET | Freq: Four times a day (QID) | ORAL | Status: DC | PRN

## 2013-03-30 MED ORDER — HEPARIN SOD (PORK) LOCK FLUSH 100 UNIT/ML IV SOLN
500.0000 [IU] | Freq: Once | INTRAVENOUS | Status: AC
  Administered 2013-03-30: 500 [IU] via INTRAVENOUS
  Filled 2013-03-30: qty 5

## 2013-03-30 MED ORDER — SODIUM CHLORIDE 0.9 % IJ SOLN
10.0000 mL | INTRAMUSCULAR | Status: DC | PRN
  Administered 2013-03-30: 10 mL via INTRAVENOUS
  Filled 2013-03-30: qty 10

## 2013-03-30 MED ORDER — OXYCODONE HCL 5 MG PO TABS: 5.0000 mg | ORAL_TABLET | Freq: Four times a day (QID) | ORAL | Status: DC | PRN

## 2013-03-30 MED ORDER — ALPRAZOLAM 0.5 MG PO TABS: 0.5000 mg | ORAL_TABLET | Freq: Every evening | ORAL | Status: DC | PRN

## 2013-03-30 MED ORDER — HYDROCODONE-ACETAMINOPHEN 5-325 MG PO TABS: 1.0000 | ORAL_TABLET | Freq: Four times a day (QID) | ORAL | Status: DC | PRN

## 2013-03-30 NOTE — Telephone Encounter (Signed)
gv and printed appt sched and avs for pt for Jan 2015 °

## 2013-03-30 NOTE — Progress Notes (Signed)
   [facility]    OFFICE PROGRESS NOTE   INTERVAL HISTORY:   Return for a scheduled visit. She reports intermittent abdominal pain relieved with hydrocodone and oxycodone. Some days she does not take pain medication. She had increased nausea and vomiting last week. She relates this to a "bug ". The stool is firmer since she started fiber. She has bilateral "hip "discomfort at night, but not during the day. She is active at home. She drinks one resource supplement per day.  Objective:  Vital signs in last 24 hours:  Blood pressure 99/66, pulse 91, temperature 97.4 F (36.3 C), temperature source Oral, resp. rate 20, height 5\' 3"  (1.6 m), weight 105 lb 8 oz (47.854 kg).   Resp: Lungs clear bilaterally Cardio: Regular rate and rhythm GI: Left lower quadrant ostomy with a small amount of liquid stool. The abdomen is soft. No hepatomegaly. Nontender Vascular: No leg edema    Portacath/PICC-without erythema     Medications: I have reviewed the patient's current medications.  Assessment/Plan: 1. Extended right colectomy on 09/17/2011 with pathology confirming a T4, N2, M1 tumor. 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 Status post FOLFOX (3 cycles) with progressive nausea and weight loss. Chemotherapy switched to FOLFIRI (2 cycles). Admission with a bowel obstruction December 2013 status post jejuno-colic anastomosis with resolution of obstructive symptoms, progressive carcinomatosis noted at the time of surgery. Status post FOLFIRI/Avastin cycle 1 on 06/14/2012. Cycle 4 on 07/26/2012. Status post cytoreductive surgery and HIPEC with creation of an end descending colostomy 09/25/2012.R1 resection with intraperitoneal mitomycin-C. Pathology as follows: Pelvic stripping poorly differentiated adenocarcinoma signet ring features; round ligament of the liver no malignancy identified; left gutter stripping poorly differentiated adenocarcinoma signet ring  features; uterus, bilateral fallopian tubes and ovaries, small bowel, right bowel en block resection with poorly differentiated adenocarcinoma with signet ring features invading through muscularis propria into surrounding adipose tissue and present multifocally at serosal surface. Intestinal resection margins (x4) negative for carcinoma. 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 27 of 30 lymph nodes involved by carcinoma, extracapsular extension present. Multiple deposits of carcinoma within surrounding adipose tissue. Anastomosis site with mucosal erosion. Endometrial polyp and serosal adhesions not involved by malignancy. Unremarkable fallopian tubes. 2. History of a microcytic anemia. Likely iron deficiency. 3. Weight loss secondary to progressive metastatic colon cancer with a bowel obstruction.and cytoreductive surgery 4. Left arm passport. 5. Crampy abdominal pain during the irinotecan infusion cycle 2 relieved with atropine. 6. Diarrhea following cycle 2 FOLFIRI/Avastin. The diarrhea was controlled with Imodium. 7. Admission 07/19/2012 with intractable nausea/vomiting 8. Nausea/vomiting/diarrhea beginning on day 4 following the FOLFIRI/Avastin given on 07/26/2012-likely irinotecan-induced nausea and enteritis 9. Admission with febrile neutropenia and an abdominal incision abscess/fistula 08/06/2012, 10. Hospitalization at [facility] 10/16/2012 for abdominal pain. Found to have a right lower quadrant fluid collection status post percutaneous drainage. She was discharged home with a drain. The drain has been removed.   Disposition:  She appears to be slowly declining from metastatic colon cancer. Her pain is under adequate control with the current narcotic regimen. The Port-A-Cath was flushed today. She will return for an office visit and Port-A-Cath flush in 6 weeks. She is scheduled to see Dr. Lenis Noon in January.   Thornton Papas, MD  03/30/2013  2:16 PM

## 2013-04-03 ENCOUNTER — Telehealth: Payer: Self-pay | Admitting: *Deleted

## 2013-04-03 NOTE — Telephone Encounter (Signed)
Message from pt's friend reporting she has "a bad cold". Asking what she can take for this? Reviewed with Dr. Truett Perna: OK to use OTC cold remedy. Called pt with this info. She voiced understanding.

## 2013-04-06 ENCOUNTER — Telehealth: Payer: Self-pay | Admitting: *Deleted

## 2013-04-06 NOTE — Telephone Encounter (Signed)
Message from pt's friend Luanne reporting her symptoms have not improved. Thinks she may have "a touch of the flu." Requesting an appt for next week for pt to be seen in this office. Reviewed with Dr. Truett Perna: Pt should go to an urgent care office or PCP at the [facility] if she thinks she has the flu. Will work pt in if they think it is related to her cancer. Returned call to [place] with this info. She voiced understanding.

## 2013-04-30 ENCOUNTER — Other Ambulatory Visit: Payer: Self-pay | Admitting: *Deleted

## 2013-04-30 ENCOUNTER — Telehealth: Payer: Self-pay | Admitting: *Deleted

## 2013-04-30 DIAGNOSIS — C189 Malignant neoplasm of colon, unspecified: Secondary | ICD-10-CM

## 2013-04-30 NOTE — Telephone Encounter (Signed)
Asking if she can be seen by Dr. Benay Spice on 1/15 or 1/16 instead of on 1/23 since she is in town this week? Sees Dr. Clovis Riley at [facility] on 1/14. Reports she has recently had the flu and was in the hospital for 3 days in [place] and her labs were "out of wack". She also was on antibiotic for strep throat. Reports she is constantly thirsty now and everything she drinks, "just goes right through me".  Made her aware that her request will be relayed to MD.

## 2013-04-30 NOTE — Progress Notes (Signed)
Per Dr. Benay Spice : Can be seen on 05/04/13 by Ned Card, NP. Patient agrees to this visit w/lab.

## 2013-05-04 ENCOUNTER — Ambulatory Visit (HOSPITAL_BASED_OUTPATIENT_CLINIC_OR_DEPARTMENT_OTHER): Payer: Medicare Other | Admitting: Nurse Practitioner

## 2013-05-04 ENCOUNTER — Other Ambulatory Visit (HOSPITAL_BASED_OUTPATIENT_CLINIC_OR_DEPARTMENT_OTHER): Payer: Medicare Other

## 2013-05-04 ENCOUNTER — Ambulatory Visit: Payer: Medicare Other

## 2013-05-04 ENCOUNTER — Telehealth: Payer: Self-pay | Admitting: *Deleted

## 2013-05-04 VITALS — BP 103/80 | HR 108 | Temp 97.8°F | Resp 18 | Ht 63.0 in | Wt 109.2 lb

## 2013-05-04 DIAGNOSIS — C189 Malignant neoplasm of colon, unspecified: Secondary | ICD-10-CM

## 2013-05-04 DIAGNOSIS — C180 Malignant neoplasm of cecum: Secondary | ICD-10-CM

## 2013-05-04 DIAGNOSIS — C800 Disseminated malignant neoplasm, unspecified: Secondary | ICD-10-CM

## 2013-05-04 DIAGNOSIS — C172 Malignant neoplasm of ileum: Secondary | ICD-10-CM

## 2013-05-04 DIAGNOSIS — C772 Secondary and unspecified malignant neoplasm of intra-abdominal lymph nodes: Secondary | ICD-10-CM

## 2013-05-04 DIAGNOSIS — R634 Abnormal weight loss: Secondary | ICD-10-CM

## 2013-05-04 DIAGNOSIS — R197 Diarrhea, unspecified: Secondary | ICD-10-CM

## 2013-05-04 DIAGNOSIS — C786 Secondary malignant neoplasm of retroperitoneum and peritoneum: Secondary | ICD-10-CM

## 2013-05-04 LAB — CBC WITH DIFFERENTIAL/PLATELET
BASO%: 0.8 % (ref 0.0–2.0)
BASOS ABS: 0.1 10*3/uL (ref 0.0–0.1)
EOS%: 8.4 % — ABNORMAL HIGH (ref 0.0–7.0)
Eosinophils Absolute: 0.8 10*3/uL — ABNORMAL HIGH (ref 0.0–0.5)
HEMATOCRIT: 36.5 % (ref 34.8–46.6)
HGB: 12.1 g/dL (ref 11.6–15.9)
LYMPH%: 22 % (ref 14.0–49.7)
MCH: 30.7 pg (ref 25.1–34.0)
MCHC: 33.1 g/dL (ref 31.5–36.0)
MCV: 92.5 fL (ref 79.5–101.0)
MONO#: 0.8 10*3/uL (ref 0.1–0.9)
MONO%: 8.9 % (ref 0.0–14.0)
NEUT#: 5.4 10*3/uL (ref 1.5–6.5)
NEUT%: 59.9 % (ref 38.4–76.8)
Platelets: 296 10*3/uL (ref 145–400)
RBC: 3.94 10*6/uL (ref 3.70–5.45)
RDW: 15.2 % — ABNORMAL HIGH (ref 11.2–14.5)
WBC: 9 10*3/uL (ref 3.9–10.3)
lymph#: 2 10*3/uL (ref 0.9–3.3)

## 2013-05-04 LAB — COMPREHENSIVE METABOLIC PANEL (CC13)
ALT: 27 U/L (ref 0–55)
AST: 32 U/L (ref 5–34)
Albumin: 3.7 g/dL (ref 3.5–5.0)
Alkaline Phosphatase: 85 U/L (ref 40–150)
Anion Gap: 13 mEq/L — ABNORMAL HIGH (ref 3–11)
BILIRUBIN TOTAL: 0.24 mg/dL (ref 0.20–1.20)
BUN: 25.4 mg/dL (ref 7.0–26.0)
CALCIUM: 8.7 mg/dL (ref 8.4–10.4)
CO2: 23 mEq/L (ref 22–29)
CREATININE: 1.7 mg/dL — AB (ref 0.6–1.1)
Chloride: 100 mEq/L (ref 98–109)
Glucose: 84 mg/dl (ref 70–140)
Potassium: 4 mEq/L (ref 3.5–5.1)
Sodium: 137 mEq/L (ref 136–145)
Total Protein: 7.4 g/dL (ref 6.4–8.3)

## 2013-05-04 LAB — CLOSTRIDIUM DIFFICILE BY PCR: Toxigenic C. Difficile by PCR: NEGATIVE

## 2013-05-04 MED ORDER — OXYCODONE HCL 5 MG PO TABS: 5.0000 mg | ORAL_TABLET | Freq: Four times a day (QID) | ORAL | Status: DC | PRN

## 2013-05-04 NOTE — Telephone Encounter (Signed)
appts made and printed...td 

## 2013-05-04 NOTE — Progress Notes (Addendum)
OFFICE PROGRESS NOTE  Interval history:  Kristen Alexander returns for followup of metastatic colon cancer. She reports developing significant weakness and anorexia in December 2014. She was seen in an emergency department and admitted to the hospital for 3 days. Immediately following discharge from the hospital she was diagnosed with strep throat. She completed a 2 week course of amoxicillin. She subsequently developed watery loose stools. She may empty the ostomy bag as many as 10-15 times a day. She recently began Lomotil with some improvement. Abdominal pain overall is well controlled. She takes oxycodone no more than twice a day and some days does not take any. She denies nausea/vomiting. She reports good fluid intake.  She reports a recent CT scan at [facility]. CT of the chest, abdomen and pelvis on 05/02/2013 showed periaortic adenopathy had increased since the prior exam 08/30/2012. Right lower quadrant mesenteric lymph nodes appeared unchanged. Previously noted right lower lobe lung nodule had resolved. Several bilateral subcentimeter hypodensities in the kidney noted. Focal hepatic steatosis adjacent to the falciform ligament on prior examinations was not apparent on the current study. Generalized hepatic steatosis was noted.   Objective: Filed Vitals:   05/04/13 1157  BP: 103/80  Pulse: 108  Temp: 97.8 F (36.6 C)  Resp: 18   Oropharynx is without thrush or ulceration. Lungs are clear. Regular cardiac rhythm. Abdomen is soft and nontender. No hepatomegaly. Liquid stool in the ostomy collection bag. No leg edema. Left arm port site without erythema.   Lab Results: Lab Results  Component Value Date   WBC 9.0 05/04/2013   HGB 12.1 05/04/2013   HCT 36.5 05/04/2013   MCV 92.5 05/04/2013   PLT 296 05/04/2013   NEUTROABS 5.4 05/04/2013    Chemistry:    Chemistry      Component Value Date/Time   NA 137 05/04/2013 1120   NA 125* 10/16/2012 0253   K 4.0 05/04/2013 1120   K 3.7 10/16/2012 0253   CL 87* 10/16/2012 0253   CL 106 08/23/2012 0833   CO2 23 05/04/2013 1120   CO2 25 10/16/2012 0253   BUN 25.4 05/04/2013 1120   BUN 11 10/16/2012 0253   CREATININE 1.7* 05/04/2013 1120   CREATININE 0.52 10/16/2012 0253      Component Value Date/Time   CALCIUM 8.7 05/04/2013 1120   CALCIUM 9.3 10/16/2012 0253   ALKPHOS 85 05/04/2013 1120   ALKPHOS 185* 10/16/2012 0253   AST 32 05/04/2013 1120   AST 15 10/16/2012 0253   ALT 27 05/04/2013 1120   ALT 11 10/16/2012 0253   BILITOT 0.24 05/04/2013 1120   BILITOT 0.3 10/16/2012 0253       Studies/Results: No results found.  Medications: I have reviewed the patient's current medications.  Assessment/Plan: 1. Extended right colectomy on 09/17/2011 with pathology confirming a T4, N2, M1 tumor. 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 Status post FOLFOX (3 cycles) with progressive nausea and weight loss. Chemotherapy switched to FOLFIRI (2 cycles). Admission with a bowel obstruction December 2013 status post jejuno-colic anastomosis with resolution of obstructive symptoms, progressive carcinomatosis noted at the time of surgery. Status post FOLFIRI/Avastin cycle 1 on 06/14/2012. Cycle 4 on 07/26/2012. Status post cytoreductive surgery and HIPEC with creation of an end descending colostomy 09/25/2012.R1 resection with intraperitoneal mitomycin-C. Pathology as follows: Pelvic stripping poorly differentiated adenocarcinoma signet ring features; round ligament of the liver no malignancy identified; left gutter stripping poorly differentiated adenocarcinoma signet ring features; uterus, bilateral fallopian tubes and ovaries, small bowel,  right bowel en block resection with poorly differentiated adenocarcinoma with signet ring features invading through muscularis propria into surrounding adipose tissue and present multifocally at serosal surface. Intestinal resection margins (x4) negative for carcinoma. 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 27 of 30 lymph  nodes involved by carcinoma, extracapsular extension present. Multiple deposits of carcinoma within surrounding adipose tissue. Anastomosis site with mucosal erosion. Endometrial polyp and serosal adhesions not involved by malignancy. Unremarkable fallopian tubes. 2. History of a microcytic anemia. Likely iron deficiency. 3. Weight loss secondary to progressive metastatic colon cancer with a bowel obstruction and cytoreductive surgery 4. Left arm passport. 5. Crampy abdominal pain during the irinotecan infusion cycle 2 relieved with atropine. 6. Diarrhea following cycle 2 FOLFIRI/Avastin. The diarrhea was controlled with Imodium. 7. Admission 07/19/2012 with intractable nausea/vomiting 8. Nausea/vomiting/diarrhea beginning on day 4 following the FOLFIRI/Avastin given on 07/26/2012-likely irinotecan-induced nausea and enteritis 9. Admission with febrile neutropenia and an abdominal incision abscess/fistula 08/06/2012, 10. Hospitalization at [facility] 10/16/2012 for abdominal pain. Found to have a right lower quadrant fluid collection status post percutaneous drainage. She was discharged home with a drain. The drain has been removed. 11. Hospitalization December 2014 with "weakness". 12. Recent diagnosis of "strep throat". She completed a course of amoxicillin. 13. Diarrhea. 14. Elevated BUN/creatinine. Question related to diarrhea.   Dispositon-she appears stable. The etiology of the diarrhea is unclear. As noted above she was recently hospitalized and completed a course of antibiotics. She will submit a stool sample for C. difficile testing. If negative we will give her instructions on taking Lomotil and Imodium.  Labs today show an increase in the BUN/creatinine as compared to her baseline. Lab values are similar to those obtained 05/02/2013 at [facility]. She will push fluids. She will have repeat labs done on 05/10/2013 and forwarded to our office.  Kristen Alexander confirmed at today's visit that she  in not interested in pursuing systemic therapy and would like to continue a supportive care approach.   She will return for a followup visit in 6 weeks.   Patient seen with Dr. Benay Spice.  25 minutes were spent face-to-face at today's visit with the majority of the time involved in counseling/coordination of care.  Ned Card ANP/GNP-BC   This was a shared visit with Ned Card. Kristen Alexander was interviewed and examined. The plan is to continue supportive care for treatment of the metastatic carcinoma of the cecum.she confirmed that she does not wish to consider additional systemic therapy.   Julieanne Manson, M.D.

## 2013-05-08 ENCOUNTER — Telehealth: Payer: Self-pay | Admitting: *Deleted

## 2013-05-08 NOTE — Telephone Encounter (Signed)
Message copied by Brien Few on Tue May 08, 2013  4:04 PM ------      Message from: Ladell Pier      Created: Tue May 08, 2013  2:07 PM       Please call patient, c. Diff is negative ------

## 2013-05-11 ENCOUNTER — Ambulatory Visit: Payer: Medicare Other | Admitting: Oncology

## 2013-05-11 NOTE — Telephone Encounter (Signed)
Left message on voicemail informing pt of normal lab. 

## 2013-05-28 ENCOUNTER — Encounter: Payer: Self-pay | Admitting: *Deleted

## 2013-05-28 NOTE — Progress Notes (Signed)
BMP results from [facility] received and reviewed by MD. Stable.

## 2013-05-29 ENCOUNTER — Encounter (HOSPITAL_COMMUNITY): Payer: Self-pay | Admitting: Emergency Medicine

## 2013-05-29 ENCOUNTER — Inpatient Hospital Stay (HOSPITAL_COMMUNITY)
Admission: EM | Admit: 2013-05-29 | Discharge: 2013-05-31 | DRG: 641 | Disposition: A | Discharge status: Home / Self Care | Payer: Medicare Other | Attending: Internal Medicine | Admitting: Internal Medicine

## 2013-05-29 DIAGNOSIS — Z79899 Other long term (current) drug therapy: Secondary | ICD-10-CM

## 2013-05-29 DIAGNOSIS — E46 Unspecified protein-calorie malnutrition: Secondary | ICD-10-CM | POA: Diagnosis present

## 2013-05-29 DIAGNOSIS — K5669 Other intestinal obstruction: Secondary | ICD-10-CM | POA: Diagnosis present

## 2013-05-29 DIAGNOSIS — R1110 Vomiting, unspecified: Secondary | ICD-10-CM

## 2013-05-29 DIAGNOSIS — R1013 Epigastric pain: Secondary | ICD-10-CM

## 2013-05-29 DIAGNOSIS — C800 Disseminated malignant neoplasm, unspecified: Secondary | ICD-10-CM | POA: Diagnosis present

## 2013-05-29 DIAGNOSIS — E876 Hypokalemia: Secondary | ICD-10-CM | POA: Diagnosis present

## 2013-05-29 DIAGNOSIS — Z888 Allergy status to other drugs, medicaments and biological substances status: Secondary | ICD-10-CM

## 2013-05-29 DIAGNOSIS — C189 Malignant neoplasm of colon, unspecified: Secondary | ICD-10-CM | POA: Diagnosis present

## 2013-05-29 DIAGNOSIS — IMO0002 Reserved for concepts with insufficient information to code with codable children: Secondary | ICD-10-CM

## 2013-05-29 DIAGNOSIS — E878 Other disorders of electrolyte and fluid balance, not elsewhere classified: Secondary | ICD-10-CM | POA: Diagnosis present

## 2013-05-29 DIAGNOSIS — R531 Weakness: Secondary | ICD-10-CM

## 2013-05-29 DIAGNOSIS — R9431 Abnormal electrocardiogram [ECG] [EKG]: Secondary | ICD-10-CM

## 2013-05-29 DIAGNOSIS — R627 Adult failure to thrive: Secondary | ICD-10-CM | POA: Diagnosis present

## 2013-05-29 DIAGNOSIS — R5383 Other fatigue: Secondary | ICD-10-CM

## 2013-05-29 DIAGNOSIS — E860 Dehydration: Secondary | ICD-10-CM | POA: Diagnosis present

## 2013-05-29 DIAGNOSIS — D638 Anemia in other chronic diseases classified elsewhere: Secondary | ICD-10-CM | POA: Diagnosis present

## 2013-05-29 DIAGNOSIS — Z933 Colostomy status: Secondary | ICD-10-CM

## 2013-05-29 DIAGNOSIS — R112 Nausea with vomiting, unspecified: Secondary | ICD-10-CM | POA: Diagnosis present

## 2013-05-29 DIAGNOSIS — R5381 Other malaise: Secondary | ICD-10-CM | POA: Diagnosis present

## 2013-05-29 DIAGNOSIS — D649 Anemia, unspecified: Secondary | ICD-10-CM

## 2013-05-29 LAB — COMPREHENSIVE METABOLIC PANEL
ALBUMIN: 2.8 g/dL — AB (ref 3.5–5.2)
ALK PHOS: 74 U/L (ref 39–117)
ALT: 18 U/L (ref 0–35)
ALT: 18 U/L (ref 0–35)
AST: 27 U/L (ref 0–37)
AST: 30 U/L (ref 0–37)
Albumin: 2.8 g/dL — ABNORMAL LOW (ref 3.5–5.2)
Alkaline Phosphatase: 68 U/L (ref 39–117)
BILIRUBIN TOTAL: 0.2 mg/dL — AB (ref 0.3–1.2)
BUN: 11 mg/dL (ref 6–23)
BUN: 9 mg/dL (ref 6–23)
CALCIUM: 5.8 mg/dL — AB (ref 8.4–10.5)
CHLORIDE: 100 meq/L (ref 96–112)
CO2: 29 mEq/L (ref 19–32)
CO2: 26 meq/L (ref 19–32)
CREATININE: 0.89 mg/dL (ref 0.50–1.10)
Calcium: 6.7 mg/dL — ABNORMAL LOW (ref 8.4–10.5)
Chloride: 94 mEq/L — ABNORMAL LOW (ref 96–112)
Creatinine, Ser: 0.91 mg/dL (ref 0.50–1.10)
GFR calc Af Amer: 77 mL/min — ABNORMAL LOW (ref >90)
GFR calc non Af Amer: 64 mL/min — ABNORMAL LOW (ref >90)
GFR, EST AFRICAN AMERICAN: 75 mL/min — AB (ref >90)
GFR, EST NON AFRICAN AMERICAN: 66 mL/min — AB (ref >90)
GLUCOSE: 102 mg/dL — AB (ref 70–99)
Glucose, Bld: 127 mg/dL — ABNORMAL HIGH (ref 70–99)
POTASSIUM: 3.5 meq/L — AB (ref 3.7–5.3)
Potassium: 2.6 mEq/L — CL (ref 3.7–5.3)
Sodium: 136 mEq/L — ABNORMAL LOW (ref 137–147)
Sodium: 140 mEq/L (ref 137–147)
TOTAL PROTEIN: 6 g/dL (ref 6.0–8.3)
Total Bilirubin: 0.2 mg/dL — ABNORMAL LOW (ref 0.3–1.2)
Total Protein: 6.2 g/dL (ref 6.0–8.3)

## 2013-05-29 LAB — URINALYSIS, ROUTINE W REFLEX MICROSCOPIC
BILIRUBIN URINE: NEGATIVE
Bilirubin Urine: NEGATIVE
GLUCOSE, UA: NEGATIVE mg/dL
GLUCOSE, UA: NEGATIVE mg/dL
Hgb urine dipstick: NEGATIVE
KETONES UR: NEGATIVE mg/dL
Ketones, ur: NEGATIVE mg/dL
Nitrite: NEGATIVE
Nitrite: NEGATIVE
PROTEIN: NEGATIVE mg/dL
Protein, ur: NEGATIVE mg/dL
SPECIFIC GRAVITY, URINE: 1.01 (ref 1.005–1.030)
Specific Gravity, Urine: 1.01 (ref 1.005–1.030)
UROBILINOGEN UA: 0.2 mg/dL (ref 0.0–1.0)
Urobilinogen, UA: 0.2 mg/dL (ref 0.0–1.0)
pH: 7 (ref 5.0–8.0)
pH: 6.5 (ref 5.0–8.0)

## 2013-05-29 LAB — URINE MICROSCOPIC-ADD ON

## 2013-05-29 LAB — BASIC METABOLIC PANEL
BUN: 12 mg/dL (ref 6–23)
CO2: 26 mEq/L (ref 19–32)
CREATININE: 0.92 mg/dL (ref 0.50–1.10)
Calcium: 6.1 mg/dL — CL (ref 8.4–10.5)
Chloride: 94 mEq/L — ABNORMAL LOW (ref 96–112)
GFR calc Af Amer: 74 mL/min — ABNORMAL LOW (ref >90)
GFR, EST NON AFRICAN AMERICAN: 63 mL/min — AB (ref >90)
Glucose, Bld: 100 mg/dL — ABNORMAL HIGH (ref 70–99)
Potassium: 2.6 mEq/L — CL (ref 3.7–5.3)
SODIUM: 137 meq/L (ref 137–147)

## 2013-05-29 LAB — CBC WITH DIFFERENTIAL/PLATELET
Basophils Absolute: 0 10*3/uL (ref 0.0–0.1)
Basophils Relative: 0 % (ref 0–1)
Eosinophils Absolute: 0.4 10*3/uL (ref 0.0–0.7)
Eosinophils Relative: 6 % — ABNORMAL HIGH (ref 0–5)
HCT: 29 % — ABNORMAL LOW (ref 36.0–46.0)
HEMOGLOBIN: 9.8 g/dL — AB (ref 12.0–15.0)
LYMPHS ABS: 1 10*3/uL (ref 0.7–4.0)
Lymphocytes Relative: 16 % (ref 12–46)
MCH: 30.7 pg (ref 26.0–34.0)
MCHC: 33.8 g/dL (ref 30.0–36.0)
MCV: 90.9 fL (ref 78.0–100.0)
MONO ABS: 0.5 10*3/uL (ref 0.1–1.0)
MONOS PCT: 8 % (ref 3–12)
NEUTROS ABS: 4.4 10*3/uL (ref 1.7–7.7)
Neutrophils Relative %: 70 % (ref 43–77)
Platelets: 336 10*3/uL (ref 150–400)
RBC: 3.19 MIL/uL — AB (ref 3.87–5.11)
RDW: 13 % (ref 11.5–15.5)
WBC: 6.3 10*3/uL (ref 4.0–10.5)

## 2013-05-29 LAB — MAGNESIUM
MAGNESIUM: 0.3 mg/dL — AB (ref 1.5–2.5)
Magnesium: 1 mg/dL — ABNORMAL LOW (ref 1.5–2.5)

## 2013-05-29 LAB — TSH: TSH: 2.05 u[IU]/mL (ref 0.350–4.500)

## 2013-05-29 LAB — PHOSPHORUS: Phosphorus: 3 mg/dL (ref 2.3–4.6)

## 2013-05-29 LAB — OCCULT BLOOD, POC DEVICE: FECAL OCCULT BLD: POSITIVE — AB

## 2013-05-29 LAB — TROPONIN I

## 2013-05-29 LAB — PRO B NATRIURETIC PEPTIDE: Pro B Natriuretic peptide (BNP): 484.8 pg/mL — ABNORMAL HIGH (ref 0–125)

## 2013-05-29 MED ORDER — SODIUM CHLORIDE 0.9 % IV SOLN
1.0000 g | Freq: Once | INTRAVENOUS | Status: DC
  Administered 2013-05-29: 1 g via INTRAVENOUS

## 2013-05-29 MED ORDER — ONDANSETRON HCL 4 MG PO TABS: 4.0000 mg | ORAL_TABLET | Freq: Four times a day (QID) | ORAL | Status: DC | PRN

## 2013-05-29 MED ORDER — DIPHENOXYLATE-ATROPINE 2.5-0.025 MG PO TABS: 1.0000 | ORAL_TABLET | Freq: Four times a day (QID) | ORAL | Status: DC | PRN

## 2013-05-29 MED ORDER — ONDANSETRON HCL 4 MG/2ML IJ SOLN
4.0000 mg | Freq: Once | INTRAMUSCULAR | Status: AC
  Administered 2013-05-29: 4 mg via INTRAVENOUS
  Filled 2013-05-29: qty 2

## 2013-05-29 MED ORDER — SODIUM CHLORIDE 0.9 % IV BOLUS (SEPSIS)
1000.0000 mL | Freq: Once | INTRAVENOUS | Status: AC
  Administered 2013-05-29: 1000 mL via INTRAVENOUS

## 2013-05-29 MED ORDER — LORATADINE 10 MG PO TABS
10.0000 mg | ORAL_TABLET | Freq: Every day | ORAL | Status: DC
  Administered 2013-05-29 – 2013-05-31 (×3): 10 mg via ORAL
  Filled 2013-05-29 (×3): qty 1

## 2013-05-29 MED ORDER — ONDANSETRON 4 MG PO TBDP
4.0000 mg | ORAL_TABLET | Freq: Three times a day (TID) | ORAL | Status: DC | PRN
  Filled 2013-05-29: qty 1

## 2013-05-29 MED ORDER — POTASSIUM CHLORIDE 10 MEQ/100ML IV SOLN
10.0000 meq | Freq: Once | INTRAVENOUS | Status: AC
Start: 2013-05-29 — End: 2013-05-29
  Administered 2013-05-29: 10 meq via INTRAVENOUS
  Filled 2013-05-29: qty 100

## 2013-05-29 MED ORDER — MAGNESIUM SULFATE 40 MG/ML IJ SOLN
2.0000 g | Freq: Once | INTRAMUSCULAR | Status: AC
  Administered 2013-05-29: 2 g via INTRAVENOUS
  Filled 2013-05-29: qty 50

## 2013-05-29 MED ORDER — HYDROMORPHONE HCL PF 1 MG/ML IJ SOLN
1.0000 mg | Freq: Once | INTRAMUSCULAR | Status: AC
  Administered 2013-05-29: 1 mg via INTRAVENOUS
  Filled 2013-05-29: qty 1

## 2013-05-29 MED ORDER — OXYCODONE HCL 5 MG PO TABS
5.0000 mg | ORAL_TABLET | Freq: Four times a day (QID) | ORAL | Status: DC | PRN
  Administered 2013-05-30: 5 mg via ORAL
  Filled 2013-05-29: qty 1

## 2013-05-29 MED ORDER — POTASSIUM CHLORIDE CRYS ER 20 MEQ PO TBCR
40.0000 meq | EXTENDED_RELEASE_TABLET | Freq: Once | ORAL | Status: AC
  Administered 2013-05-29: 40 meq via ORAL
  Filled 2013-05-29: qty 2

## 2013-05-29 MED ORDER — MORPHINE SULFATE 4 MG/ML IJ SOLN
6.0000 mg | Freq: Once | INTRAMUSCULAR | Status: AC
  Administered 2013-05-29: 6 mg via INTRAVENOUS
  Filled 2013-05-29: qty 2

## 2013-05-29 MED ORDER — STERILE WATER FOR INJECTION IJ SOLN
INTRAMUSCULAR | Status: AC
  Administered 2013-05-29: 10 mL
  Filled 2013-05-29: qty 10

## 2013-05-29 MED ORDER — ALPRAZOLAM 0.5 MG PO TABS
0.5000 mg | ORAL_TABLET | Freq: Every evening | ORAL | Status: DC | PRN
  Administered 2013-05-30: 1 mg via ORAL
  Filled 2013-05-29: qty 1
  Filled 2013-05-29: qty 2

## 2013-05-29 MED ORDER — POTASSIUM CHLORIDE CRYS ER 20 MEQ PO TBCR
40.0000 meq | EXTENDED_RELEASE_TABLET | Freq: Two times a day (BID) | ORAL | Status: AC
  Administered 2013-05-29 – 2013-05-30 (×2): 40 meq via ORAL
  Filled 2013-05-29 (×2): qty 2

## 2013-05-29 MED ORDER — SODIUM CHLORIDE 0.9 % IV SOLN
INTRAVENOUS | Status: AC
  Administered 2013-05-29: 16:00:00 via INTRAVENOUS

## 2013-05-29 MED ORDER — POTASSIUM CHLORIDE 10 MEQ/100ML IV SOLN
10.0000 meq | Freq: Once | INTRAVENOUS | Status: AC
  Administered 2013-05-29: 10 meq via INTRAVENOUS
  Filled 2013-05-29: qty 100

## 2013-05-29 MED ORDER — HYDROMORPHONE HCL PF 1 MG/ML IJ SOLN
1.0000 mg | INTRAMUSCULAR | Status: DC | PRN
  Administered 2013-05-29 – 2013-05-30 (×3): 1 mg via INTRAVENOUS
  Filled 2013-05-29 (×3): qty 1

## 2013-05-29 MED ORDER — ONDANSETRON HCL 4 MG/2ML IJ SOLN: 4.0000 mg | Freq: Four times a day (QID) | INTRAMUSCULAR | Status: DC | PRN

## 2013-05-29 MED ORDER — SODIUM CHLORIDE 0.9 % IV SOLN
INTRAVENOUS | Status: DC
  Administered 2013-05-30: 06:00:00 via INTRAVENOUS

## 2013-05-29 MED ORDER — SODIUM CHLORIDE 0.9 % IV SOLN
2.0000 g | Freq: Once | INTRAVENOUS | Status: AC
  Administered 2013-05-29: 2 g via INTRAVENOUS
  Filled 2013-05-29: qty 20

## 2013-05-29 MED ORDER — HYDROCODONE-ACETAMINOPHEN 5-325 MG PO TABS
1.0000 | ORAL_TABLET | Freq: Four times a day (QID) | ORAL | Status: DC | PRN
  Administered 2013-05-30: 1 via ORAL
  Filled 2013-05-29: qty 1

## 2013-05-29 MED ORDER — SODIUM CHLORIDE 0.9 % IJ SOLN
3.0000 mL | Freq: Two times a day (BID) | INTRAMUSCULAR | Status: DC
  Administered 2013-05-29 – 2013-05-30 (×2): 3 mL via INTRAVENOUS

## 2013-05-29 MED ORDER — ASPIRIN-ACETAMINOPHEN-CAFFEINE 250-250-65 MG PO TABS
1.0000 | ORAL_TABLET | Freq: Four times a day (QID) | ORAL | Status: DC | PRN
  Administered 2013-05-29 – 2013-05-30 (×2): 2 via ORAL
  Filled 2013-05-29 (×3): qty 2

## 2013-05-29 MED ORDER — ENOXAPARIN SODIUM 40 MG/0.4ML ~~LOC~~ SOLN
40.0000 mg | SUBCUTANEOUS | Status: DC
Start: 2013-05-29 — End: 2013-05-31
  Administered 2013-05-29 – 2013-05-30 (×2): 40 mg via SUBCUTANEOUS
  Filled 2013-05-29 (×3): qty 0.4

## 2013-05-29 MED ORDER — SODIUM CHLORIDE 0.9 % IV SOLN
1.0000 g | Freq: Once | INTRAVENOUS | Status: AC
  Administered 2013-05-29: 1 g via INTRAVENOUS
  Filled 2013-05-29: qty 10

## 2013-05-29 NOTE — ED Notes (Signed)
Pt states she cannot give urine sample at this time.

## 2013-05-29 NOTE — ED Notes (Signed)
Pt reports hx of colon cancer, has colostomy now, but output is more similar to ileostomy, pt has had decreased stool output. Pt reports she has chronic abdominal pain, but pain changed/ increased yesterday, at present pain 5/10. Reports vomiting x2 days with dizziness. Reports when she is sick stool smells different and currently stool has that smell.

## 2013-05-29 NOTE — Progress Notes (Signed)
Pt arrived from ED on stretcher to room 1411. Pt slid self stretcher to bed. A&Ox4. VSS. States she feels much better than upon initial presentation in ED. Oriented to callbell and environment. POC discussed. Tele box 11 confirmed w/ CCMD.

## 2013-05-29 NOTE — ED Notes (Signed)
Pt encouraged to void when able. 

## 2013-05-29 NOTE — ED Notes (Signed)
Kristen Alexander at bedside attempting blood draw for labs. Per [name] unable to draw labs with accessing port. Pt a/o x4. Pt request something for pain. EDP Zavitz made aware.

## 2013-05-29 NOTE — Progress Notes (Signed)
Patient had 6 beats of V-tach. Vitals were: 98.37F,69,18,110/64, 99% on RA.  Patient was asymptomatic. PCP on call was notified.

## 2013-05-29 NOTE — H&P (Addendum)
[facility] History and Physical  Kristen Alexander HYW:[id number] DOB: 05/01/1945 DOA: 05/29/2013  Referring physician: ED physician PCP: Florina Ou, MD   Chief Complaint: nausea and vomiting, fatigue   HPI:  Pt is 68 yo female with stage 4 colon cancer colostomy placement, now presented to [facility] ED with main concern of several days duration or progressively worsening generalized weakness, fatigue, nausea and non bloody vomiting, poor oral intake. Pt also reports associated generalized abdominal pain with cramps, intermittent and non radiating, 5/10 in severity, no specific alleviating or aggravating factors. Pt explains she is not on chemotherapy and radiation therapy. No fevers, chills, no chest pain or shortness of breath.   In ED, pt found to be in mild distress due to pain, electrolyte panel with K 2.6, Mg 0.3, Ca 5.8. TRH asked to admit to telemetry bed.   Assessment and Plan: Active Problems: Generalized weakness - most likely secondary to progressive failure to thrive and poor oral intake, severe electrolyte depletion - will admit to telemetry bed and will supplement electrolytes - will place on IVF, provide analgesia as needed and antiemetics as needed - will also place on regular diet, encouraged PO intake as pt able to tolerate - will need PT evaluation Hypokalemia - will low Mg and Ca - will continue to supplement - will repeat BMP now and Mg level - will repeat again in AM and continue to supplement as indicated Colon cancer - will place order in [facility] for oncology consult, Dr. Benay Spice  Anemia of chronic disease - Hg and Hct stable, no signs of active bleeding - CBC in AM  Code Status: Full Family Communication: Pt at bedside Disposition Plan: Admit to telemetry bed   Review of Systems:  Constitutional: Negative for fever, chills . Negative for diaphoresis.  HENT: Negative for hearing loss, ear pain, nosebleeds, congestion, sore throat, neck pain, tinnitus and  ear discharge.   Eyes: Negative for blurred vision, double vision, photophobia, pain, discharge and redness.  Respiratory: Negative for cough, hemoptysis, sputum production, shortness of breath, wheezing and stridor.   Cardiovascular: Negative for chest pain, palpitations, orthopnea, claudication and leg swelling.  Gastrointestinal: Negative for heartburn, constipation, blood in stool and melena.  Genitourinary: Negative for dysuria, urgency, frequency, hematuria and flank pain.  Musculoskeletal: Negative for myalgias, back pain, joint pain and falls.  Skin: Negative for itching and rash.  Neurological: Negative for tingling, tremors, sensory change, speech change, focal weakness, loss of consciousness and headaches.  Endo/Heme/Allergies: Negative for environmental allergies and polydipsia. Does not bruise/bleed easily.  Psychiatric/Behavioral: Negative for suicidal ideas. The patient is not nervous/anxious.      Past Medical History  Diagnosis Date  . 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 Anemia 08/2011    Past Surgical History  Procedure Laterality Date  . Appendectomy    . Colon resection    . Knee arthroscopy Left   . Portacath placement Left 01/2012    left arm  . Colostomy      June 2014    Social History:  reports that she has never smoked. She has never used smokeless tobacco. She reports that she does not drink alcohol or use illicit drugs.  Allergies  Allergen Reactions  . Ativan [Lorazepam] Other (See Comments)    Hyperactivity   . Benadryl [Diphenhydramine Hcl] Other (See Comments)    Restless and hyperactive    No known family medical history   Prior to Admission medications   Medication Sig Start  Date End Date Taking? Authorizing Provider  ALPRAZolam Duanne Moron) 0.5 MG tablet Take 0.5-1 mg by mouth at bedtime as needed for sleep. 03/30/13  Yes Ladell Pier, MD  aspirin-acetaminophen-caffeine [facility] MIGRAINE) [phone] MG per tablet Take 1-2  tablets by mouth every 6 (six) hours as needed for headache or migraine.    Yes Historical Provider, MD  diphenoxylate-atropine (LOMOTIL) 2.5-0.025 MG per tablet Take 1 tablet by mouth 4 (four) times daily as needed for diarrhea or loose stools. 03/30/13  Yes Ladell Pier, MD  Fiber, Guar Gum, CHEW Chew 4 tablets by mouth daily.   Yes Historical Provider, MD  HYDROcodone-acetaminophen (NORCO/VICODIN) 5-325 MG per tablet Take 1 tablet by mouth every 6 (six) hours as needed for moderate pain. 03/30/13  Yes Ladell Pier, MD  loratadine (CLARITIN) 10 MG tablet Take 10 mg by mouth daily.   Yes Historical Provider, MD  Multiple Vitamins-Minerals (MULTIVITAMIN GUMMIES ADULT PO) Take 2 tablets by mouth daily.   Yes Historical Provider, MD  ondansetron (ZOFRAN-ODT) 4 MG disintegrating tablet Take 4 mg by mouth every 8 (eight) hours as needed for nausea or vomiting.   Yes Historical Provider, MD  oxyCODONE (OXY IR/ROXICODONE) 5 MG immediate release tablet Take 5-10 mg by mouth every 6 (six) hours as needed for severe pain. 05/04/13  Yes Owens Shark, NP  Phenyleph-CPM-DM-Aspirin (ALKA-SELTZER PLUS COLD & COUGH) 7.11-18-08-325 MG TBEF Take 2 tablets by mouth every 6 (six) hours as needed (cough).   Yes Historical Provider, MD  pseudoephedrine-acetaminophen (TYLENOL SINUS) 30-500 MG TABS Take 2 tablets by mouth every 6 (six) hours as needed (congestion).   Yes Historical Provider, MD    Physical Exam: Filed Vitals:   05/29/13 0924 05/29/13 1257  BP: 118/59 110/53  Pulse: 86 78  Temp: 98.7 F (37.1 C)   TempSrc: Oral   Resp: 16 19  SpO2: 99% 99%    Physical Exam  Constitutional: Appears well-developed and well-nourished. No distress.  HENT: Normocephalic. External right and left ear normal. Dry MM Eyes: Conjunctivae and EOM are normal. PERRLA, no scleral icterus.  Neck: Normal ROM. Neck supple. No JVD. No tracheal deviation. No thyromegaly.  CVS: RRR, S1/S2 +, no murmurs, no gallops, no carotid  bruit.  Pulmonary: Effort and breath sounds normal, no stridor, rhonchi, diminished breath sounds at bases   Abdominal: Soft. BS +,  no distension, tenderness, rebound or guarding.  Musculoskeletal: Normal range of motion. No edema and no tenderness.  Lymphadenopathy: No lymphadenopathy noted, cervical, inguinal. Neuro: Alert. Normal reflexes, muscle tone coordination. No cranial nerve deficit. Skin: Skin is warm and dry. No rash noted. Not diaphoretic. No erythema. No pallor.  Psychiatric: Normal mood and affect. Behavior, judgment, thought content normal.   Labs on Admission:  Basic Metabolic Panel:  Recent Labs Lab 05/29/13 1056  NA 136*  137  K 2.6*  2.6*  CL 94*  94*  CO2 29  26  GLUCOSE 102*  100*  BUN 11  12  CREATININE 0.91  0.92  CALCIUM 5.8*  6.1*  MG 0.3*   Liver Function Tests:  Recent Labs Lab 05/29/13 1056  AST 27  ALT 18  ALKPHOS 68  BILITOT 0.2*  PROT 6.0  ALBUMIN 2.8*   CBC:  Recent Labs Lab 05/29/13 1056  WBC 6.3  NEUTROABS 4.4  HGB 9.8*  HCT 29.0*  MCV 90.9  PLT 336   Radiological Exams on Admission: No results found.  EKG: Normal sinus rhythm, no ST/T wave changes  Faye Ramsay, MD  [facility] Pager [phone]  If 7PM-7AM, please contact night-coverage [email or web address] Password [email or web address] 05/29/2013, 4:20 PM

## 2013-05-29 NOTE — ED Provider Notes (Signed)
CSN: [id number]     Arrival date & time 05/29/13  0915 History   First MD Initiated Contact with Patient 05/29/13 [phone]     Chief Complaint  Patient presents with  . Abdominal Pain  . hx colon cancer      (Consider location/radiation/quality/duration/timing/severity/associated sxs/prior Treatment) HPI Comments: 68 yo female with stage 4 colon CA, resection hx, anemia, colostomy presents with recurrent vomiting/ central abdo pain/ cramping worse the past two days.  Mild lightheaded.  Pt is not a candidate for chemo or radiation, she has onc fup.  She has had multiple similar in the past.  No recent abx.  Increased output recently.  Pain similar to previous No fevers. Non radiating.  Patient is a 68 y.o. female presenting with abdominal pain. The history is provided by the patient and a relative.  Abdominal Pain Associated symptoms: fatigue, nausea and vomiting   Associated symptoms: no chest pain, no chills, no dysuria, no fever and no shortness of breath     Past Medical History  Diagnosis Date  . 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 Anemia 08/2011   Past Surgical History  Procedure Laterality Date  . Appendectomy    . Colon resection    . Knee arthroscopy Left   . Portacath placement Left 01/2012    left arm  . Colostomy      June 2014   History reviewed. No pertinent family history. History  Substance Use Topics  . Smoking status: Never Smoker   . Smokeless tobacco: Not on file  . Alcohol Use: No   OB History   Grav Para Term Preterm Abortions TAB SAB Ect Mult Living                 Review of Systems  Constitutional: Positive for fatigue. Negative for fever and chills.  HENT: Negative for congestion.   Eyes: Negative for visual disturbance.  Respiratory: Negative for shortness of breath.   Cardiovascular: Negative for chest pain.  Gastrointestinal: Positive for nausea, vomiting and abdominal pain. Negative for blood in stool.  Genitourinary:  Negative for dysuria and flank pain.  Musculoskeletal: Negative for back pain, neck pain and neck stiffness.  Skin: Negative for rash.  Neurological: Positive for light-headedness. Negative for headaches.      Allergies  Ativan and Benadryl  Home Medications   Current Outpatient Rx  Name  Route  Sig  Dispense  Refill  . ALPRAZolam (XANAX) 0.5 MG tablet   Oral   Take 0.5-1 mg by mouth at bedtime as needed for sleep.         Marland Kitchen aspirin-acetaminophen-caffeine (EXCEDRIN MIGRAINE) 250-250-65 MG per tablet   Oral   Take 1-2 tablets by mouth every 6 (six) hours as needed for headache or migraine.          . diphenoxylate-atropine (LOMOTIL) 2.5-0.025 MG per tablet   Oral   Take 1 tablet by mouth 4 (four) times daily as needed for diarrhea or loose stools.         . Fiber, Guar Gum, CHEW   Oral   Chew 4 tablets by mouth daily.         Marland Kitchen HYDROcodone-acetaminophen (NORCO/VICODIN) 5-325 MG per tablet   Oral   Take 1 tablet by mouth every 6 (six) hours as needed for moderate pain.         Marland Kitchen loratadine (CLARITIN) 10 MG tablet   Oral   Take 10 mg by mouth daily.         Marland Kitchen  Multiple Vitamins-Minerals (MULTIVITAMIN GUMMIES ADULT PO)   Oral   Take 2 tablets by mouth daily.         . ondansetron (ZOFRAN-ODT) 4 MG disintegrating tablet   Oral   Take 4 mg by mouth every 8 (eight) hours as needed for nausea or vomiting.         Marland Kitchen oxyCODONE (OXY IR/ROXICODONE) 5 MG immediate release tablet   Oral   Take 5-10 mg by mouth every 6 (six) hours as needed for severe pain.         Marland Kitchen Phenyleph-CPM-DM-Aspirin (ALKA-SELTZER PLUS COLD & COUGH) 7.11-18-08-325 MG TBEF   Oral   Take 2 tablets by mouth every 6 (six) hours as needed (cough).         . pseudoephedrine-acetaminophen (TYLENOL SINUS) 30-500 MG TABS   Oral   Take 2 tablets by mouth every 6 (six) hours as needed (congestion).          BP 118/59  Pulse 86  Temp(Src) 98.7 F (37.1 C) (Oral)  Resp 16  SpO2  99% Physical Exam  Nursing note and vitals reviewed. Constitutional: She is oriented to person, place, and time. She appears well-developed and well-nourished.  HENT:  Head: Normocephalic and atraumatic.  Dry mm  Eyes: Conjunctivae are normal. Right eye exhibits no discharge. Left eye exhibits no discharge.  Neck: Normal range of motion. Neck supple. No tracheal deviation present.  Cardiovascular: Normal rate and regular rhythm.   Pulmonary/Chest: Effort normal and breath sounds normal.  Abdominal: Soft. She exhibits no distension. There is tenderness (central and epigastric). There is no guarding.  Musculoskeletal: She exhibits no edema.  Neurological: She is alert and oriented to person, place, and time.  Skin: Skin is warm. No rash noted.  Psychiatric: She has a normal mood and affect.    ED Course  Procedures (including critical care time) Labs Review Labs Reviewed  COMPREHENSIVE METABOLIC PANEL - Abnormal; Notable for the following:    Sodium 136 (*)    Potassium 2.6 (*)    Chloride 94 (*)    Glucose, Bld 102 (*)    Calcium 5.8 (*)    Albumin 2.8 (*)    Total Bilirubin 0.2 (*)    GFR calc non Af Amer 64 (*)    GFR calc Af Amer 75 (*)    All other components within normal limits  CBC WITH DIFFERENTIAL - Abnormal; Notable for the following:    RBC 3.19 (*)    Hemoglobin 9.8 (*)    HCT 29.0 (*)    Eosinophils Relative 6 (*)    All other components within normal limits  URINALYSIS, ROUTINE W REFLEX MICROSCOPIC - Abnormal; Notable for the following:    Leukocytes, UA MODERATE (*)    All other components within normal limits  URINE MICROSCOPIC-ADD ON - Abnormal; Notable for the following:    Squamous Epithelial / LPF FEW (*)    All other components within normal limits  MAGNESIUM - Abnormal; Notable for the following:    Magnesium 0.3 (*)    All other components within normal limits  BASIC METABOLIC PANEL - Abnormal; Notable for the following:    Potassium 2.6 (*)     Chloride 94 (*)    Glucose, Bld 100 (*)    Calcium 6.1 (*)    GFR calc non Af Amer 63 (*)    GFR calc Af Amer 74 (*)    All other components within normal limits  OCCULT BLOOD, POC DEVICE -  Abnormal; Notable for the following:    Fecal Occult Bld POSITIVE (*)    All other components within normal limits  OCCULT BLOOD X 1 CARD TO LAB, STOOL  BASIC METABOLIC PANEL   Imaging Review No results found.  EKG Interpretation    Date/Time:  Tuesday May 29 2013 14:15:16 EST Ventricular Rate:  73 PR Interval:  131 QRS Duration: 87 QT Interval:  610 QTC Calculation: 672 R Axis:   55 Text Interpretation:  Prolonged QT Non specific ST and T wave flattening Confirmed by Ernesto Zukowski  MD, Izell Labat ([id number]) on 05/29/2013 4:29:57 PM            MDM   Final diagnoses:  None   Clinically dehydrated.  Pt has advanced colon CA, pt is okay with fluids/ labs.  I felt CT abdo would not change mgmt, pain similar to previous.   Multiple electrolyte abnormalities. Ca, K, Mg all low. EKG done, QT prolonged. Pt on monitor.   Rechecked multiple times, improved. Fluid boluses given.  [facility] Dr Doyle Askew accepted and evaluated in ED. Electrolyte replacement ordered.  The patients results and plan were reviewed and discussed.   Any x-rays performed were personally reviewed by myself.   Differential diagnosis were considered with the presenting HPI.  Diagnosis: HypoCalcemia, Hypokalemia, Hypomagnesemia, Dehydration, Vomiting, Colon CA with mets  EKG: prolonged QT  Admission/ observation were discussed with the admitting physician, patient and/or family and they are comfortable with the plan.       Mariea Clonts, MD 05/29/13 1630

## 2013-05-30 DIAGNOSIS — R112 Nausea with vomiting, unspecified: Secondary | ICD-10-CM

## 2013-05-30 DIAGNOSIS — D638 Anemia in other chronic diseases classified elsewhere: Secondary | ICD-10-CM

## 2013-05-30 DIAGNOSIS — R5381 Other malaise: Secondary | ICD-10-CM

## 2013-05-30 DIAGNOSIS — R5383 Other fatigue: Secondary | ICD-10-CM

## 2013-05-30 DIAGNOSIS — R1115 Cyclical vomiting syndrome unrelated to migraine: Secondary | ICD-10-CM

## 2013-05-30 DIAGNOSIS — C189 Malignant neoplasm of colon, unspecified: Secondary | ICD-10-CM

## 2013-05-30 DIAGNOSIS — E876 Hypokalemia: Secondary | ICD-10-CM

## 2013-05-30 LAB — BASIC METABOLIC PANEL
BUN: 9 mg/dL (ref 6–23)
CHLORIDE: 103 meq/L (ref 96–112)
CO2: 25 meq/L (ref 19–32)
Calcium: 7.2 mg/dL — ABNORMAL LOW (ref 8.4–10.5)
Creatinine, Ser: 0.85 mg/dL (ref 0.50–1.10)
GFR calc non Af Amer: 70 mL/min — ABNORMAL LOW (ref >90)
GFR, EST AFRICAN AMERICAN: 81 mL/min — AB (ref >90)
Glucose, Bld: 89 mg/dL (ref 70–99)
POTASSIUM: 3.5 meq/L — AB (ref 3.7–5.3)
SODIUM: 139 meq/L (ref 137–147)

## 2013-05-30 LAB — CBC
HEMATOCRIT: 28.7 % — AB (ref 36.0–46.0)
Hemoglobin: 9.4 g/dL — ABNORMAL LOW (ref 12.0–15.0)
MCH: 30.4 pg (ref 26.0–34.0)
MCHC: 32.8 g/dL (ref 30.0–36.0)
MCV: 92.9 fL (ref 78.0–100.0)
Platelets: 353 10*3/uL (ref 150–400)
RBC: 3.09 MIL/uL — AB (ref 3.87–5.11)
RDW: 13.3 % (ref 11.5–15.5)
WBC: 8.3 10*3/uL (ref 4.0–10.5)

## 2013-05-30 LAB — TROPONIN I: Troponin I: <0.3 ng/mL (ref <0.30)

## 2013-05-30 MED ORDER — MAGNESIUM OXIDE 400 (241.3 MG) MG PO TABS
400.0000 mg | ORAL_TABLET | Freq: Two times a day (BID) | ORAL | Status: DC
  Administered 2013-05-30 – 2013-05-31 (×2): 400 mg via ORAL
  Filled 2013-05-30 (×3): qty 1

## 2013-05-30 MED ORDER — SODIUM CHLORIDE 0.9 % IJ SOLN
10.0000 mL | INTRAMUSCULAR | Status: DC | PRN
  Administered 2013-05-31: 10 mL

## 2013-05-30 NOTE — Care Management Note (Signed)
    Page 1 of 1   05/30/2013     4:36:20 PM   CARE MANAGEMENT NOTE 05/30/2013  Patient:  Kristen Alexander, Kristen Alexander   Account Number:  [id number]  Date Initiated:  05/30/2013  Documentation initiated by:  Dessa Phi  Subjective/Objective Assessment:   68 Y/O F ADMITTED W/HYPOCALCEMIA.     Action/Plan:   FROM HOME.   Anticipated DC Date:  06/01/2013   Anticipated DC Plan:  [place]  CM consult      Choice offered to / List presented to:             Status of service:  In process, will continue to follow Medicare Important Message given?   (If response is "NO", the following Medicare IM given date fields will be blank) Date Medicare IM given:   Date Additional Medicare IM given:    Discharge Disposition:    Per UR Regulation:  Reviewed for med. necessity/level of care/duration of stay  If discussed at [place] of Stay Meetings, dates discussed:    Comments:  05/30/13 Brayley Mackowiak RN,BSN NCM [email or web address].NO ANTICIPATED D/C NEEDS.

## 2013-05-30 NOTE — Progress Notes (Signed)
[facility] PROGRESS NOTE  Kristen Alexander OXB:[id number] DOB: 12-Sep-1945 DOA: 05/29/2013 PCP: Kristen Ou, MD  Assessment/Plan: 1. Nausea/vomiting. Could be secondary to tumor burden or perhaps related to underlying infectious process such as infectious viral gastroenteritis. She did not vomit overnight, this morning her diet was advanced. Will watch her for the next 24 hours, she remains stable and is tolerating by mouth intake plan to discharge her in a.m. 2. History of metastatic colon cancer. At the present time she is not on systemic therapy, is being seen at the [facility]. She was seen and evaluated by Dr. Benay Alexander of medical oncology during this hospitalization. 3. Hypokalemia. Patient presenting with a potassium of 2.6, improved to 3.5 on this mornings lab work. 4. Hypomagnesemia. Patient receiving magnesium, as she initially presented with a mag level of 0.3  Code Status: Full code Disposition Plan: Will monitor her over the next 24 hours to assure that she is tolerating by mouth intake if remains stable plan to discharge her in a.m.   Consultants:  Dr. Benay Alexander of medical oncology  HPI/Subjective: Patient is a pleasant 68 year old female with a past medical history of stage IV colon cancer status post colostomy placement, admitted to the medicine service on 05/29/2013 presenting with multiple episodes of nausea, vomiting, generalized, weakness, fatigue, BMP showing multiple electrolyte abnormalities. During this hospitalization she was seen by her medical oncologist Dr.Sherrill. Patient reporting doing better this morning, her diet was advanced.  Objective: Filed Vitals:   05/30/13 1406  BP: 115/59  Pulse: 74  Temp: 97.3 F (36.3 C)  Resp: 16    Intake/Output Summary (Last 24 hours) at 05/30/13 1507 Last data filed at 05/30/13 1500  Gross per 24 hour  Intake 2270.84 ml  Output   3455 ml  Net -1184.16 ml   Filed Weights   05/29/13 1713  Weight: 53.8 kg  (118 lb 9.7 oz)    Exam:   General:  Nontoxic appearing, she is awake alert, interactive.  Cardiovascular: Regular rate and rhythm normal S1-S2  Respiratory: Lungs are clear to auscultation bilaterally   Abdomen: Status post ostomy placement, abdomen otherwise benign  Musculoskeletal: No edema  Data Reviewed: Basic Metabolic Panel:  Recent Labs Lab 05/29/13 1056 05/29/13 1835 05/30/13 0535  NA 136*  137 140 139  K 2.6*  2.6* 3.5* 3.5*  CL 94*  94* 100 103  CO2 29  26 26 25   GLUCOSE 102*  100* 127* 89  BUN 11  12 9 9   CREATININE 0.91  0.92 0.89 0.85  CALCIUM 5.8*  6.1* 6.7* 7.2*  MG 0.3* 1.0*  --   PHOS  --  3.0  --    Liver Function Tests:  Recent Labs Lab 05/29/13 1056 05/29/13 1835  AST 27 30  ALT 18 18  ALKPHOS 68 74  BILITOT 0.2* 0.2*  PROT 6.0 6.2  ALBUMIN 2.8* 2.8*   No results found for this basename: LIPASE, AMYLASE,  in the last 168 hours No results found for this basename: AMMONIA,  in the last 168 hours CBC:  Recent Labs Lab 05/29/13 1056 05/30/13 0535  WBC 6.3 8.3  NEUTROABS 4.4  --   HGB 9.8* 9.4*  HCT 29.0* 28.7*  MCV 90.9 92.9  PLT 336 353   Cardiac Enzymes:  Recent Labs Lab 05/29/13 1835 05/30/13 0010 05/30/13 0535 05/30/13 1210  TROPONINI <0.30 <0.30 <0.30 <0.30   BNP (last 3 results)  Recent Labs  05/29/13 1830  PROBNP 484.8*   CBG: No  results found for this basename: GLUCAP,  in the last 168 hours  No results found for this or any previous visit (from the past 240 hour(s)).   Studies: No results found.  Scheduled Meds: . enoxaparin (LOVENOX) injection  40 mg Subcutaneous Q24H  . loratadine  10 mg Oral Daily  . sodium chloride  3 mL Intravenous Q12H   Continuous Infusions: . sodium chloride 50 mL/hr at 05/30/13 0550    Active Problems:   Hypocalcemia   Hypokalemia    Time spent: 25 minutes    Kristen Alexander  [facility] Pager [phone]. If 7PM-7AM, please contact  night-coverage at [email or web address], password [facility] 05/30/2013, 3:07 PM  LOS: 1 day

## 2013-05-30 NOTE — Progress Notes (Signed)
IP PROGRESS NOTE  Subjective:   Kristen Alexander is well-known to me with a history of metastatic colon cancer. She reports a 2 day history of nausea and vomiting with "weakness ". She presented to the emergency room yesterday and was admitted for hydration and electrolyte replacement. She reports that prior to the past 2 days she felt well. She has been eating and physically active. Her bowels are functioning. No nausea this morning.  Objective: Vital signs in last 24 hours: Blood pressure 100/43, pulse 60, temperature 98.1 F (36.7 C), temperature source Oral, resp. rate 18, height 5\' 3"  (1.6 m), weight 118 lb 9.7 oz (53.8 kg), SpO2 97.00%.  Intake/Output from previous day: 02/10 0701 - 02/11 0700 In: 1670.8 [P.O.:840; I.V.:670.8; IV Piggyback:160] Out: 540 [Urine:300; Stool:240]  Physical Exam:  HEENT: No thrush Lungs: Clear bilaterally Cardiac: Regular rate and rhythm Abdomen: Nontender, liquid stool in the ostomy bag, firm in the right abdomen Extremities: No leg edema   Portacath/PICC-without erythema  Lab Results:  Recent Labs  05/29/13 1056 05/30/13 0535  WBC 6.3 8.3  HGB 9.8* 9.4*  HCT 29.0* 28.7*  PLT 336 353    BMET  Recent Labs  05/29/13 1835 05/30/13 0535  NA 140 139  K 3.5* 3.5*  CL 100 103  CO2 26 25  GLUCOSE 127* 89  BUN 9 9  CREATININE 0.89 0.85  CALCIUM 6.7* 7.2*   albumin 2.8 on 05/29/2013  Studies/Results: No results found.  Medications: I have reviewed the patient's current medications.  Assessment/Plan:  1. Metastatic colon cancer with abdominal carcinomatosis, currently maintained off of specific therapy.  2. Nausea/vomiting-most likely related to carcinomatosis and intermittent partial small bowel obstruction.  3. Multiple electrolyte abnormalities secondary to malnutrition and nausea/vomiting  4. Anemia of chronic disease   Kristen Alexander appears at baseline today. I suspect the nausea and vomiting are related to the abdominal  tumor burden. She does not appear completely obstructed. She is not a candidate for further systemic chemotherapy.  Recommendations:  1. Intravenous hydration, electrolyte replacement  2. Advance diet as tolerated  Okay for discharge from an oncology standpoint if she is able to maintain hydration by mouth.  She can followup as scheduled at the [facility] 06/15/2013.   LOS: 1 day   Henleigh Robello  05/30/2013, 8:54 AM

## 2013-05-31 MED ORDER — OXYCODONE HCL 5 MG PO TABS: 5.0000 mg | ORAL_TABLET | Freq: Four times a day (QID) | ORAL | Status: DC | PRN

## 2013-05-31 MED ORDER — HEPARIN SOD (PORK) LOCK FLUSH 100 UNIT/ML IV SOLN
500.0000 [IU] | INTRAVENOUS | Status: DC | PRN
  Administered 2013-05-31: 500 [IU]
  Filled 2013-05-31: qty 5

## 2013-05-31 MED ORDER — HEPARIN SOD (PORK) LOCK FLUSH 100 UNIT/ML IV SOLN
500.0000 [IU] | INTRAVENOUS | Status: DC
  Filled 2013-05-31: qty 5

## 2013-05-31 MED ORDER — MAGNESIUM OXIDE 400 (241.3 MG) MG PO TABS: 400.0000 mg | ORAL_TABLET | Freq: Two times a day (BID) | ORAL | Status: DC

## 2013-05-31 MED ORDER — POTASSIUM CHLORIDE ER 10 MEQ PO TBCR: 40.0000 meq | EXTENDED_RELEASE_TABLET | Freq: Every day | ORAL | Status: DC

## 2013-05-31 NOTE — Progress Notes (Signed)
D/C instructions reviewed w/.  Pt verbalizes understanding and all questions answered. Pt d/c in w/c by NT to friend's car. Pt in stable condition and in possession of d/c instructions, scripts, and all personal belongings.

## 2013-05-31 NOTE — Discharge Summary (Signed)
Physician Discharge Summary  Kristen Alexander TDD:[id number] DOB: 07-24-1945 DOA: 05/29/2013  PCP: Florina Ou, MD  Admit date: 05/29/2013 Discharge date: 05/31/2013  Time spent: 35 minutes  Recommendations for Outpatient Follow-up:  1. Please followup on a BMP and magnesium level on hospital followup visit.  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 Pt also reports associated generalized abdominal pain with cramps, intermittent and non radiating, 5/10 in severity, no specific alleviating or aggravating factors. Pt explains she is not on chemotherapy and radiation therapy. No fevers, chills, no chest pain or shortness of breath.  In ED, pt found to be in mild distress due to pain, electrolyte panel with K 2.6, Mg 0.3, Ca 5.8. [facility] asked to admit to telemetry bed.  Hospital Course:  Patient is a pleasant 67 year old female with a past medical history of stage IV colon cancer, status post colostomy placement, who was admitted to the medicine service on 05/29/2013 presenting with multiple episodes of nausea vomiting, generalized weakness and fatigue. Initial lab work showed potassium of 2.6 with magnesium of 0.3. She was profoundly dehydrated given intractable nausea vomiting and minimal by mouth intake, started on IV fluid resuscitation and admitted to the medicine service.  Her electrolytes were replaced and by 05/30/2013 her potassium had improved to 3.5. She did not have further episodes of nausea and vomiting. During this hospitalization she was seen by her medical oncologist Dr. Benay Spice. Nausea vomiting likely related to carcinomatosis with intermittent partial small bowel obstruction. Given resolution to her symptoms, tolerating by mouth intake, with improvement electrolytes, she was discharged in stable condition on 05/31/2013.   Consultations:  Dr. Benay Spice of medical oncology  Discharge Exam: Filed Vitals:   05/31/13 0900  BP: 107/54  Pulse: 76  Temp: 98.2 F (36.8 C)  Resp: 20    General: The patient states doing well, feeling better, has not had further episodes of nausea and vomiting. She is tolerating by mouth intake and anxious to go home today. Cardiovascular: Regular rate and rhythm normal S1 S2 Respiratory: Clear to auscultation bilaterally Abdomen: Soft nontender nondistended positive bowel sounds  Discharge Instructions      Discharge Orders   Future Appointments Provider Department Dept Phone   06/15/2013 11:00 AM [facility] [phone]   06/15/2013 11:15 AM [facility] [phone]   06/15/2013 11:45 AM Ladell Pier, MD [facility] [phone]   Future Orders Complete By Expires   Call MD for:  difficulty breathing, headache or visual disturbances  As directed    Call MD for:  extreme fatigue  As directed    Call MD for:  persistant dizziness or light-headedness  As directed    Call MD for:  persistant nausea and vomiting  As directed    Call MD for:  severe uncontrolled pain  As directed    Call MD for:  temperature >100.4  As directed    Diet - low sodium heart healthy  As directed    Increase activity slowly  As directed        Medication List    STOP taking these medications        Fiber (Guar Gum) Chew      TAKE these medications       ALKA-SELTZER PLUS COLD & COUGH 7.11-18-08-325 MG Tbef  Generic drug:  Phenyleph-CPM-DM-Aspirin  Take 2 tablets by mouth every 6 (six) hours as needed (cough).     ALPRAZolam 0.5 MG tablet  Commonly known as:  XANAX  Take 0.5-1 mg by mouth at bedtime as needed for sleep.     aspirin-acetaminophen-caffeine [id number] MG per tablet  Commonly known as:  EXCEDRIN MIGRAINE  Take 1-2 tablets by mouth every 6 (six) hours as needed for headache or migraine.     diphenoxylate-atropine 2.5-0.025 MG per tablet  Commonly known as:  LOMOTIL  Take 1 tablet by mouth 4 (four) times daily as needed for diarrhea or loose stools.     HYDROcodone-acetaminophen 5-325 MG per tablet  Commonly known as:  NORCO/VICODIN  Take 1 tablet by mouth every 6 (six) hours as needed for moderate pain.     loratadine 10 MG tablet  Commonly known as:  CLARITIN  Take 10 mg by mouth daily.     magnesium oxide 400 (241.3 MG) MG tablet  Commonly known as:  MAG-OX  Take 1 tablet (400 mg total) by mouth 2 (two) times daily.     MULTIVITAMIN GUMMIES ADULT PO  Take 2 tablets by mouth daily.     ondansetron 4 MG disintegrating tablet  Commonly known as:  ZOFRAN-ODT  Take 4 mg by mouth every 8 (eight) hours as needed for nausea or vomiting.     oxyCODONE 5 MG immediate release tablet  Commonly known as:  Oxy IR/ROXICODONE  Take 5-10 mg by mouth every 6 (six) hours as needed for severe pain.     oxyCODONE 5 MG immediate release tablet  Commonly known as:  Oxy IR/ROXICODONE  Take 1-2 tablets (5-10 mg total) by mouth every 6 (six) hours as needed for severe pain.     potassium chloride 10 MEQ tablet  Commonly known as:  K-DUR  Take 4 tablets (40 mEq total) by mouth daily.     pseudoephedrine-acetaminophen 30-500 MG Tabs  Commonly known as:  TYLENOL SINUS  Take 2 tablets by mouth every 6 (six) hours as needed (congestion).       Allergies  Allergen  Reactions  . Ativan [Lorazepam] Other (See Comments)    Hyperactivity   . Benadryl [Diphenhydramine Hcl] Other (See Comments)    Restless and hyperactive   Follow-up Information   Follow up with Florina Ou, MD In 1 week.   Specialty:  Family Medicine   Contact information:   [address] [place] [address] [phone]       Follow up with Betsy Coder, MD In 1 week.   Specialty:  Oncology   Contact information:   [place] [address] [phone]        The results of significant diagnostics from this hospitalization (including imaging, microbiology, ancillary and laboratory) are listed below for reference.    Significant Diagnostic Studies: No results found.  Microbiology: No results found for this or any previous  visit (from the past 240 hour(s)).   Labs: Basic Metabolic Panel:  Recent Labs Lab 05/29/13 1056 05/29/13 1835 05/30/13 0535  NA 136*  137 140 139  K 2.6*  2.6* 3.5* 3.5*  CL 94*  94* 100 103  CO2 29  26 26 25   GLUCOSE 102*  100* 127* 89  BUN 11  12 9 9   CREATININE 0.91  0.92 0.89 0.85  CALCIUM 5.8*  6.1* 6.7* 7.2*  MG 0.3* 1.0*  --   PHOS  --  3.0  --    Liver Function Tests:  Recent Labs Lab 05/29/13 1056 05/29/13 1835  AST 27 30  ALT 18 18  ALKPHOS 68 74  BILITOT 0.2* 0.2*  PROT 6.0 6.2  ALBUMIN 2.8* 2.8*   No results found for this basename: LIPASE, AMYLASE,  in the last 168 hours No results found for this basename: AMMONIA,  in the last 168 hours CBC:  Recent Labs Lab 05/29/13 1056 05/30/13 0535  WBC 6.3 8.3  NEUTROABS 4.4  --   HGB 9.8* 9.4*  HCT 29.0* 28.7*  MCV 90.9 92.9  PLT 336 353   Cardiac Enzymes:  Recent Labs Lab 05/29/13 1835 05/30/13 0010 05/30/13 0535 05/30/13 1210  TROPONINI <0.30 <0.30 <0.30 <0.30   BNP: BNP (last 3 results)  Recent Labs  05/29/13 1830  PROBNP 484.8*   CBG: No results found for this basename: GLUCAP,   in the last 168 hours     Signed:  Kelvin Cellar  [facility] 05/31/2013, 10:15 AM

## 2013-06-15 ENCOUNTER — Telehealth: Payer: Self-pay | Admitting: Oncology

## 2013-06-15 ENCOUNTER — Other Ambulatory Visit (HOSPITAL_BASED_OUTPATIENT_CLINIC_OR_DEPARTMENT_OTHER): Payer: Medicare Other

## 2013-06-15 ENCOUNTER — Telehealth: Payer: Self-pay | Admitting: *Deleted

## 2013-06-15 ENCOUNTER — Ambulatory Visit (HOSPITAL_BASED_OUTPATIENT_CLINIC_OR_DEPARTMENT_OTHER): Payer: Medicare Other | Admitting: Oncology

## 2013-06-15 ENCOUNTER — Ambulatory Visit (HOSPITAL_BASED_OUTPATIENT_CLINIC_OR_DEPARTMENT_OTHER): Payer: Medicare Other

## 2013-06-15 VITALS — BP 111/53 | HR 88 | Temp 98.0°F | Resp 18 | Ht 63.0 in | Wt 115.5 lb

## 2013-06-15 VITALS — BP 106/59 | HR 87 | Temp 97.9°F

## 2013-06-15 DIAGNOSIS — C180 Malignant neoplasm of cecum: Secondary | ICD-10-CM

## 2013-06-15 DIAGNOSIS — Z95828 Presence of other vascular implants and grafts: Secondary | ICD-10-CM

## 2013-06-15 DIAGNOSIS — C189 Malignant neoplasm of colon, unspecified: Secondary | ICD-10-CM

## 2013-06-15 DIAGNOSIS — C800 Disseminated malignant neoplasm, unspecified: Secondary | ICD-10-CM

## 2013-06-15 DIAGNOSIS — R109 Unspecified abdominal pain: Secondary | ICD-10-CM

## 2013-06-15 DIAGNOSIS — C786 Secondary malignant neoplasm of retroperitoneum and peritoneum: Secondary | ICD-10-CM

## 2013-06-15 DIAGNOSIS — R197 Diarrhea, unspecified: Secondary | ICD-10-CM

## 2013-06-15 DIAGNOSIS — C772 Secondary and unspecified malignant neoplasm of intra-abdominal lymph nodes: Secondary | ICD-10-CM

## 2013-06-15 LAB — COMPREHENSIVE METABOLIC PANEL (CC13)
ALBUMIN: 3.5 g/dL (ref 3.5–5.0)
ALT: 21 U/L (ref 0–55)
AST: 24 U/L (ref 5–34)
Alkaline Phosphatase: 72 U/L (ref 40–150)
Anion Gap: 10 mEq/L (ref 3–11)
BUN: 28.6 mg/dL — AB (ref 7.0–26.0)
CALCIUM: 9.2 mg/dL (ref 8.4–10.4)
CHLORIDE: 101 meq/L (ref 98–109)
CO2: 28 mEq/L (ref 22–29)
Creatinine: 1.1 mg/dL (ref 0.6–1.1)
Glucose: 117 mg/dl (ref 70–140)
POTASSIUM: 3.4 meq/L — AB (ref 3.5–5.1)
Sodium: 139 mEq/L (ref 136–145)
Total Bilirubin: 0.31 mg/dL (ref 0.20–1.20)
Total Protein: 7.1 g/dL (ref 6.4–8.3)

## 2013-06-15 LAB — CBC WITH DIFFERENTIAL/PLATELET
BASO%: 0.6 % (ref 0.0–2.0)
Basophils Absolute: 0 10*3/uL (ref 0.0–0.1)
EOS ABS: 1 10*3/uL — AB (ref 0.0–0.5)
EOS%: 12.6 % — ABNORMAL HIGH (ref 0.0–7.0)
HEMATOCRIT: 32.5 % — AB (ref 34.8–46.6)
HGB: 10.8 g/dL — ABNORMAL LOW (ref 11.6–15.9)
LYMPH%: 18.6 % (ref 14.0–49.7)
MCH: 31.4 pg (ref 25.1–34.0)
MCHC: 33.2 g/dL (ref 31.5–36.0)
MCV: 94.5 fL (ref 79.5–101.0)
MONO#: 0.6 10*3/uL (ref 0.1–0.9)
MONO%: 6.9 % (ref 0.0–14.0)
NEUT%: 61.3 % (ref 38.4–76.8)
NEUTROS ABS: 5 10*3/uL (ref 1.5–6.5)
Platelets: 315 10*3/uL (ref 145–400)
RBC: 3.44 10*6/uL — ABNORMAL LOW (ref 3.70–5.45)
RDW: 14.2 % (ref 11.2–14.5)
WBC: 8.2 10*3/uL (ref 3.9–10.3)
lymph#: 1.5 10*3/uL (ref 0.9–3.3)

## 2013-06-15 LAB — MAGNESIUM (CC13): MAGNESIUM: 0.9 mg/dL — AB (ref 1.5–2.5)

## 2013-06-15 MED ORDER — HEPARIN SOD (PORK) LOCK FLUSH 100 UNIT/ML IV SOLN
500.0000 [IU] | Freq: Once | INTRAVENOUS | Status: AC
  Administered 2013-06-15: 500 [IU] via INTRAVENOUS
  Filled 2013-06-15: qty 5

## 2013-06-15 MED ORDER — OXYCODONE HCL 5 MG PO TABS: 5.0000 mg | ORAL_TABLET | Freq: Four times a day (QID) | ORAL | Status: DC | PRN

## 2013-06-15 MED ORDER — POTASSIUM CHLORIDE CRYS ER 20 MEQ PO TBCR: 20.0000 meq | EXTENDED_RELEASE_TABLET | Freq: Two times a day (BID) | ORAL | Status: DC

## 2013-06-15 MED ORDER — MAGNESIUM OXIDE 400 (241.3 MG) MG PO TABS: 800.0000 mg | ORAL_TABLET | Freq: Two times a day (BID) | ORAL | Status: AC

## 2013-06-15 MED ORDER — SODIUM CHLORIDE 0.9 % IJ SOLN
10.0000 mL | INTRAMUSCULAR | Status: DC | PRN
  Administered 2013-06-15: 10 mL via INTRAVENOUS
  Filled 2013-06-15: qty 10

## 2013-06-15 NOTE — Progress Notes (Signed)
[place]    OFFICE PROGRESS NOTE   INTERVAL HISTORY:   Ms. Demeyer returns for scheduled followup of metastatic colon cancer. She was admitted 05/29/2013 with nausea and vomiting. Her symptoms improved in the hospital. No further vomiting. She takes oxycodone and hydrocodone intermittently for abdominal pain. She continues to have difficulty with adherence of the ostomy wafer. Good energy level.  Objective:  Vital signs in last 24 hours:  Blood pressure 111/53, pulse 88, temperature 98 F (36.7 C), temperature source Oral, resp. rate 18, height 5\' 3"  (1.6 m), weight 115 lb 8 oz (52.39 kg), SpO2 100.00%.    HEENT: No thrush or ulcers Resp: Lungs clear bilaterally Cardio: Regular rate and rhythm GI: No hepatomegaly, left lower quadrant colostomy with partially formed stool. Nontender. Vascular: No leg edema    Portacath/PICC-without erythema  Lab Results:  Lab Results  Component Value Date   WBC 8.2 06/15/2013   HGB 10.8* 06/15/2013   HCT 32.5* 06/15/2013   MCV 94.5 06/15/2013   PLT 315 06/15/2013   NEUTROABS 5.0 06/15/2013   Potassium 3.4, BUN 28.6, creatinine 1.1, magnesium 0.9   Medications: I have reviewed the patient's current medications.  Assessment/Plan: 1. Extended right colectomy on 09/17/2011 with pathology confirming a T4, N2, M1 tumor. 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 Status post FOLFOX (3 cycles) with progressive nausea and weight loss. Chemotherapy switched to FOLFIRI (2 cycles). Admission with a bowel obstruction December 2013 status post jejuno-colic anastomosis with resolution of obstructive symptoms, progressive carcinomatosis noted at the time of surgery. Status post FOLFIRI/Avastin cycle 1 on 06/14/2012. Cycle 4 on 07/26/2012. Status post cytoreductive surgery and HIPEC with creation of an end descending colostomy 09/25/2012.R1 resection with intraperitoneal mitomycin-C. Pathology as follows: Pelvic stripping  poorly differentiated adenocarcinoma signet ring features; round ligament of the liver no malignancy identified; left gutter stripping poorly differentiated adenocarcinoma signet ring features; uterus, bilateral fallopian tubes and ovaries, small bowel, right bowel en block resection with poorly differentiated adenocarcinoma with signet ring features invading through muscularis propria into surrounding adipose tissue and present multifocally at serosal surface. Intestinal resection margins (x4) negative for carcinoma. 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 27 of 30 lymph nodes involved by carcinoma, extracapsular extension present. Multiple deposits of carcinoma within surrounding adipose tissue. Anastomosis site with mucosal erosion. Endometrial polyp and serosal adhesions not involved by malignancy. Unremarkable fallopian tubes. 2. History of a microcytic anemia. Likely iron deficiency. 3. History of Weight loss secondary to progressive metastatic colon cancer with a bowel obstruction and cytoreductive surgery 4. Left arm passport. 5. Crampy abdominal pain during the irinotecan infusion cycle 2 relieved with atropine. 6. Diarrhea following cycle 2 FOLFIRI/Avastin. The diarrhea was controlled with Imodium. 7. Admission 07/19/2012 with intractable nausea/vomiting 8. Nausea/vomiting/diarrhea beginning on day 4 following the FOLFIRI/Avastin given on 07/26/2012-likely irinotecan-induced nausea and enteritis 9. Admission with febrile neutropenia and an abdominal incision abscess/fistula 08/06/2012, 10. Hospitalization at [facility] 10/16/2012 for abdominal pain. Found to have a right lower quadrant fluid collection status post percutaneous drainage. She was discharged home with a drain. The drain has been removed.       11. Hospitalization February 2015 with nausea/vomiting and dehydration   Disposition:  She appears stable. The plan is to continue electrolyte replacement and as needed narcotics. Ms. Stambaugh  will return for an office visit and Port-A-Cath flush in 6 weeks. She plans to schedule an appointment with an ostomy nurse to discuss management of the ostomy wafer.  Betsy Coder, MD  06/15/2013  1:25 PM

## 2013-06-15 NOTE — Telephone Encounter (Signed)
Left VM that magnesium level is very low. Per Dr. Benay Spice, increase magoxide to 800 mg twice daily. New script was sent to the pharmacy on [facility] that she requested. Also notified that K+ script sent there to take twice daily. Requested she call back to confirm she got the message.

## 2013-06-15 NOTE — Telephone Encounter (Signed)
Gave pt appt for lab and Md  °

## 2013-06-18 ENCOUNTER — Telehealth: Payer: Self-pay | Admitting: *Deleted

## 2013-06-18 NOTE — Telephone Encounter (Signed)
Called to confirm that patient picked up prescription for potassium and magnesium.  Patient confirmed she has picked up prescriptions.

## 2013-07-09 ENCOUNTER — Telehealth: Payer: Self-pay | Admitting: *Deleted

## 2013-07-09 DIAGNOSIS — C189 Malignant neoplasm of colon, unspecified: Secondary | ICD-10-CM

## 2013-07-09 DIAGNOSIS — C800 Disseminated malignant neoplasm, unspecified: Secondary | ICD-10-CM

## 2013-07-09 MED ORDER — ALPRAZOLAM 0.5 MG PO TABS: 0.5000 mg | ORAL_TABLET | Freq: Every evening | ORAL | Status: DC | PRN

## 2013-07-09 MED ORDER — HYDROCODONE-ACETAMINOPHEN 5-325 MG PO TABS: 1.0000 | ORAL_TABLET | Freq: Four times a day (QID) | ORAL | Status: DC | PRN

## 2013-07-09 MED ORDER — OXYCODONE HCL 5 MG PO TABS: 5.0000 mg | ORAL_TABLET | Freq: Four times a day (QID) | ORAL | Status: DC | PRN

## 2013-07-09 NOTE — Telephone Encounter (Signed)
Needs refills on hydrocodone, oxycodone and alprazolam. Can call her back or [facility] when ready @ [phone]

## 2013-07-09 NOTE — Telephone Encounter (Signed)
Notified Kristen Alexander that scripts are ready.

## 2013-07-24 ENCOUNTER — Ambulatory Visit (HOSPITAL_BASED_OUTPATIENT_CLINIC_OR_DEPARTMENT_OTHER): Payer: Medicare Other

## 2013-07-24 ENCOUNTER — Other Ambulatory Visit (HOSPITAL_BASED_OUTPATIENT_CLINIC_OR_DEPARTMENT_OTHER): Payer: Medicare Other

## 2013-07-24 ENCOUNTER — Telehealth: Payer: Self-pay | Admitting: Oncology

## 2013-07-24 ENCOUNTER — Ambulatory Visit (HOSPITAL_BASED_OUTPATIENT_CLINIC_OR_DEPARTMENT_OTHER): Payer: Medicare Other | Admitting: Oncology

## 2013-07-24 VITALS — BP 127/66 | HR 80 | Temp 98.2°F | Resp 18 | Ht 63.0 in | Wt 119.7 lb

## 2013-07-24 DIAGNOSIS — C172 Malignant neoplasm of ileum: Secondary | ICD-10-CM

## 2013-07-24 DIAGNOSIS — C189 Malignant neoplasm of colon, unspecified: Secondary | ICD-10-CM

## 2013-07-24 DIAGNOSIS — C180 Malignant neoplasm of cecum: Secondary | ICD-10-CM

## 2013-07-24 DIAGNOSIS — D509 Iron deficiency anemia, unspecified: Secondary | ICD-10-CM

## 2013-07-24 DIAGNOSIS — C800 Disseminated malignant neoplasm, unspecified: Secondary | ICD-10-CM

## 2013-07-24 DIAGNOSIS — C772 Secondary and unspecified malignant neoplasm of intra-abdominal lymph nodes: Secondary | ICD-10-CM

## 2013-07-24 DIAGNOSIS — Z452 Encounter for adjustment and management of vascular access device: Secondary | ICD-10-CM

## 2013-07-24 DIAGNOSIS — Z95828 Presence of other vascular implants and grafts: Secondary | ICD-10-CM

## 2013-07-24 LAB — CBC WITH DIFFERENTIAL/PLATELET
BASO%: 0.4 % (ref 0.0–2.0)
Basophils Absolute: 0 10*3/uL (ref 0.0–0.1)
EOS%: 12 % — ABNORMAL HIGH (ref 0.0–7.0)
Eosinophils Absolute: 0.7 10*3/uL — ABNORMAL HIGH (ref 0.0–0.5)
HCT: 32.6 % — ABNORMAL LOW (ref 34.8–46.6)
HGB: 10.7 g/dL — ABNORMAL LOW (ref 11.6–15.9)
LYMPH#: 1.4 10*3/uL (ref 0.9–3.3)
LYMPH%: 22.5 % (ref 14.0–49.7)
MCH: 31.1 pg (ref 25.1–34.0)
MCHC: 32.8 g/dL (ref 31.5–36.0)
MCV: 95 fL (ref 79.5–101.0)
MONO#: 0.4 10*3/uL (ref 0.1–0.9)
MONO%: 6.8 % (ref 0.0–14.0)
NEUT#: 3.5 10*3/uL (ref 1.5–6.5)
NEUT%: 58.3 % (ref 38.4–76.8)
Platelets: 282 10*3/uL (ref 145–400)
RBC: 3.44 10*6/uL — AB (ref 3.70–5.45)
RDW: 12.9 % (ref 11.2–14.5)
WBC: 6.1 10*3/uL (ref 3.9–10.3)

## 2013-07-24 LAB — COMPREHENSIVE METABOLIC PANEL (CC13)
ALT: 36 U/L (ref 0–55)
ANION GAP: 10 meq/L (ref 3–11)
AST: 51 U/L — ABNORMAL HIGH (ref 5–34)
Albumin: 3.8 g/dL (ref 3.5–5.0)
Alkaline Phosphatase: 78 U/L (ref 40–150)
BILIRUBIN TOTAL: 0.36 mg/dL (ref 0.20–1.20)
BUN: 28.1 mg/dL — ABNORMAL HIGH (ref 7.0–26.0)
CALCIUM: 8.4 mg/dL (ref 8.4–10.4)
CHLORIDE: 108 meq/L (ref 98–109)
CO2: 24 meq/L (ref 22–29)
Creatinine: 1.3 mg/dL — ABNORMAL HIGH (ref 0.6–1.1)
Glucose: 94 mg/dl (ref 70–140)
Potassium: 4.4 mEq/L (ref 3.5–5.1)
SODIUM: 142 meq/L (ref 136–145)
TOTAL PROTEIN: 7.2 g/dL (ref 6.4–8.3)

## 2013-07-24 LAB — MAGNESIUM (CC13): Magnesium: 0.8 mg/dl — CL (ref 1.5–2.5)

## 2013-07-24 MED ORDER — HYDROCODONE-ACETAMINOPHEN 5-325 MG PO TABS: 1.0000 | ORAL_TABLET | Freq: Four times a day (QID) | ORAL | Status: DC | PRN

## 2013-07-24 MED ORDER — HEPARIN SOD (PORK) LOCK FLUSH 100 UNIT/ML IV SOLN
500.0000 [IU] | Freq: Once | INTRAVENOUS | Status: AC
  Administered 2013-07-24: 500 [IU] via INTRAVENOUS
  Filled 2013-07-24: qty 5

## 2013-07-24 MED ORDER — SODIUM CHLORIDE 0.9 % IJ SOLN
10.0000 mL | INTRAMUSCULAR | Status: DC | PRN
  Administered 2013-07-24: 10 mL via INTRAVENOUS
  Filled 2013-07-24: qty 10

## 2013-07-24 NOTE — Progress Notes (Signed)
  [place] OFFICE PROGRESS NOTE   Diagnosis: Metastatic colon cancer  INTERVAL HISTORY:   She returns as scheduled. She feels well. She takes hydrocodone for intermittent cramping abdominal pain. No nausea or vomiting. The stool is more formed. She is taking magnesium oxide and potassium. She has felt stronger since beginning magnesium.  Objective:  Vital signs in last 24 hours:  Blood pressure 127/66, pulse 80, temperature 98.2 F (36.8 C), temperature source Oral, resp. rate 18, height 5\' 3"  (1.6 m), weight 119 lb 11.2 oz (54.296 kg), SpO2 100.00%.    HEENT: No thrush or ulcers Resp: Lungs clear bilaterally Cardio: Regular rate and rhythm GI: No hepatomegaly, left lower quadrant colostomy, nontender, no mass Vascular: No leg edema   Portacath/PICC-without erythema  Lab Results:  Lab Results  Component Value Date   WBC 6.1 07/24/2013   HGB 10.7* 07/24/2013   HCT 32.6* 07/24/2013   MCV 95.0 07/24/2013   PLT 282 07/24/2013   NEUTROABS 3.5 07/24/2013    Potassium 4.4, creatinine 1.3, BUN 28.1, magnesium 0.8  Medications: I have reviewed the patient's current medications.  Assessment/Plan: 1. Extended right colectomy on 09/17/2011 with pathology confirming a T4, N2, M1 tumor. 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 Status post FOLFOX (3 cycles) with progressive nausea and weight loss. Chemotherapy switched to FOLFIRI (2 cycles). Admission with a bowel obstruction December 2013 status post jejuno-colic anastomosis with resolution of obstructive symptoms, progressive carcinomatosis noted at the time of surgery. Status post FOLFIRI/Avastin cycle 1 on 06/14/2012. Cycle 4 on 07/26/2012. Status post cytoreductive surgery and HIPEC with creation of an end descending colostomy 09/25/2012.R1 resection with intraperitoneal mitomycin-C. Pathology as follows: Pelvic stripping poorly differentiated adenocarcinoma signet ring features; round ligament of the  liver no malignancy identified; left gutter stripping poorly differentiated adenocarcinoma signet ring features; uterus, bilateral fallopian tubes and ovaries, small bowel, right bowel en block resection with poorly differentiated adenocarcinoma with signet ring features invading through muscularis propria into surrounding adipose tissue and present multifocally at serosal surface. Intestinal resection margins (x4) negative for carcinoma. 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 27 of 30 lymph nodes involved by carcinoma, extracapsular extension present. Multiple deposits of carcinoma within surrounding adipose tissue. Anastomosis site with mucosal erosion. Endometrial polyp and serosal adhesions not involved by malignancy. Unremarkable fallopian tubes. 2. History of a microcytic anemia. Likely iron deficiency. 3. History of Weight loss secondary to progressive metastatic colon cancer with a bowel obstruction and cytoreductive surgery 4. Left arm passport. 5. Crampy abdominal pain during the irinotecan infusion cycle 2 relieved with atropine. 6. Diarrhea following cycle 2 FOLFIRI/Avastin. The diarrhea was controlled with Imodium. 7. Admission 07/19/2012 with intractable nausea/vomiting 8. Nausea/vomiting/diarrhea beginning on day 4 following the FOLFIRI/Avastin given on 07/26/2012-likely irinotecan-induced nausea and enteritis 9. Admission with febrile neutropenia and an abdominal incision abscess/fistula 08/06/2012, 10. Hospitalization at [facility] 10/16/2012 for abdominal pain. Found to have a right lower quadrant fluid collection status post percutaneous drainage. She was discharged home with a drain. The drain has been removed. 11. Hospitalization February 2015 with nausea/vomiting and dehydration 12. Hypomagnesemia-likely secondary to enteric loss  Disposition:  She appears stable. The plan is to continue observation/supportive care. She will return for an office visit and Port-A-Cath flush on  09/17/2013. She will continue the potassium and magnesium supplements.  Betsy Coder, MD  07/24/2013  1:04 PM

## 2013-07-24 NOTE — Patient Instructions (Signed)

## 2013-07-24 NOTE — Telephone Encounter (Signed)
Gave pt appt for lab and MD for june , no more chemo per pt

## 2013-08-02 ENCOUNTER — Other Ambulatory Visit: Payer: Self-pay | Admitting: *Deleted

## 2013-08-02 DIAGNOSIS — C800 Disseminated malignant neoplasm, unspecified: Secondary | ICD-10-CM

## 2013-08-02 DIAGNOSIS — C189 Malignant neoplasm of colon, unspecified: Secondary | ICD-10-CM

## 2013-08-02 MED ORDER — OXYCODONE HCL 5 MG PO TABS: 5.0000 mg | ORAL_TABLET | Freq: Four times a day (QID) | ORAL | Status: DC | PRN

## 2013-08-02 NOTE — Telephone Encounter (Signed)
Message from [place] requesting Oxycodone refill for pt. Called pt, she reports she is doing well, pain is controlled on current regimen. Only needs Oxycodone refilled at this time. Rx will be left in prescription book for pick up.

## 2013-08-10 ENCOUNTER — Other Ambulatory Visit: Payer: Self-pay | Admitting: *Deleted

## 2013-08-10 DIAGNOSIS — C189 Malignant neoplasm of colon, unspecified: Secondary | ICD-10-CM

## 2013-08-10 MED ORDER — HYDROCODONE-ACETAMINOPHEN 5-325 MG PO TABS: 1.0000 | ORAL_TABLET | Freq: Four times a day (QID) | ORAL | Status: DC | PRN

## 2013-08-10 NOTE — Telephone Encounter (Signed)
Message from [place] reporting pt needs refill on Hydrocodone. Spoke with pt, she takes Hydrocodone when the pain is not as severe. Rx will be left in prescription book for pick up.

## 2013-08-31 IMAGING — CR DG ABDOMEN ACUTE W/ 1V CHEST
4 series · 4 of 4 positions shown · non-contrast
Comparison: 07/19/2012

CLINICAL DATA: Abdominal pain, nausea/vomiting/diarrhea, colon
cancer

ACUTE ABDOMEN SERIES (ABDOMEN 2 VIEW & CHEST 1 VIEW)

[w chest pa]
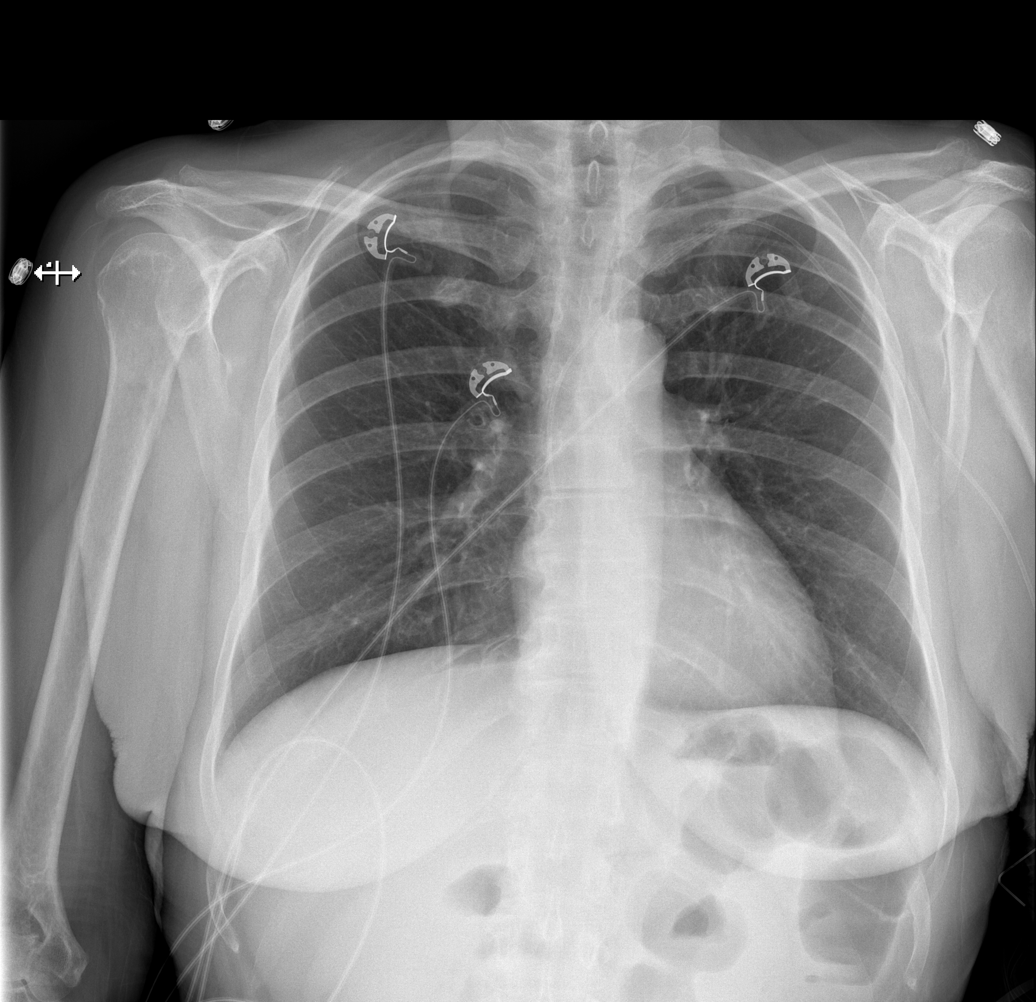

[w abdomen upright]
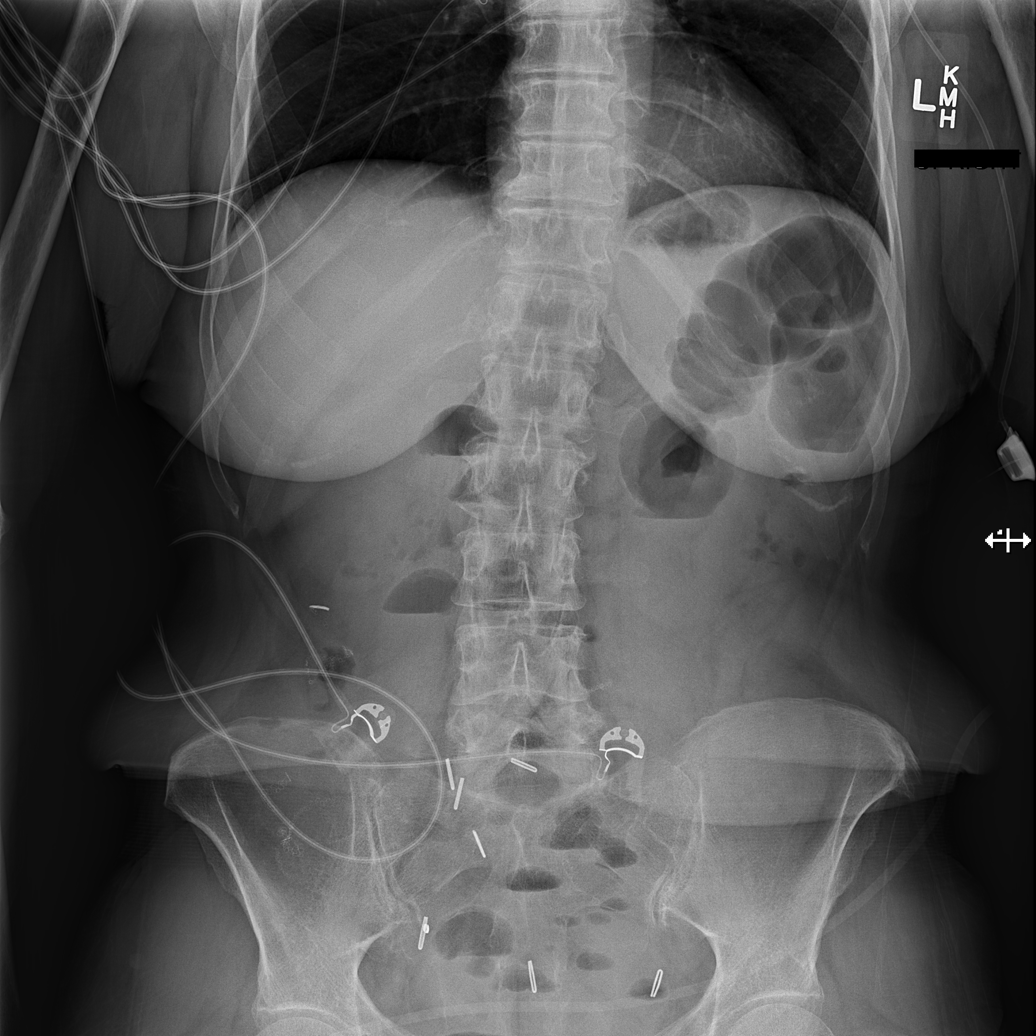

[t abdomen supine (1 of 2)]
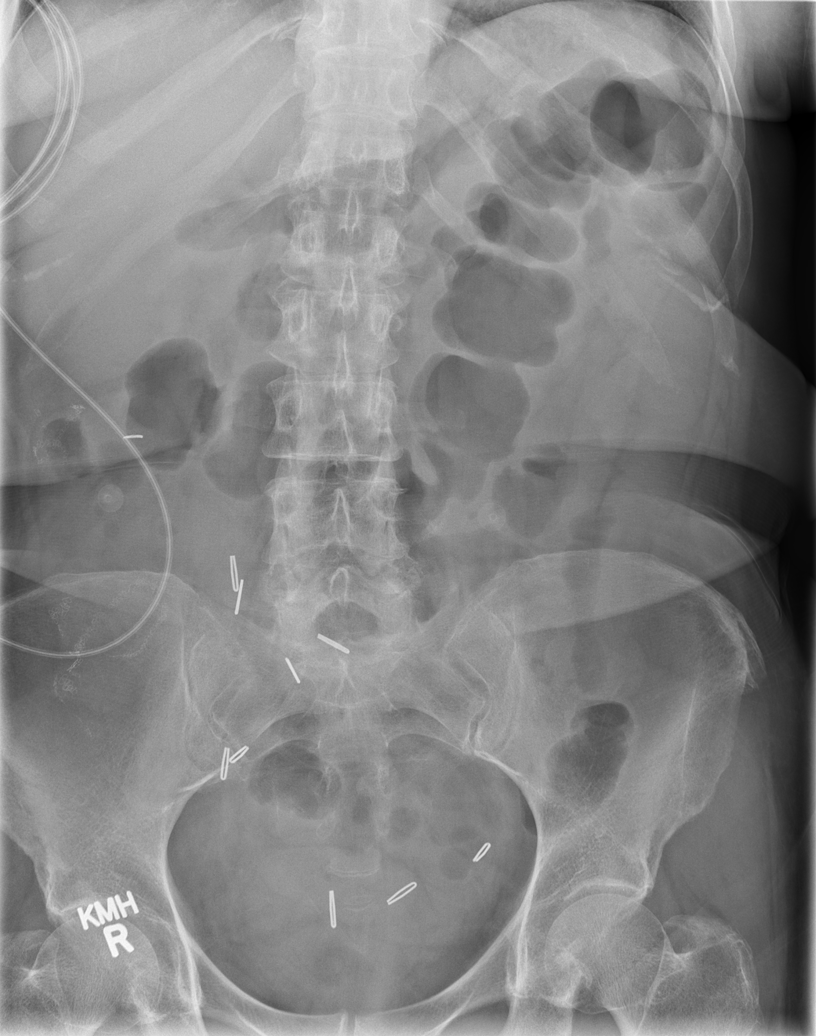

[t abdomen supine (2 of 2)]
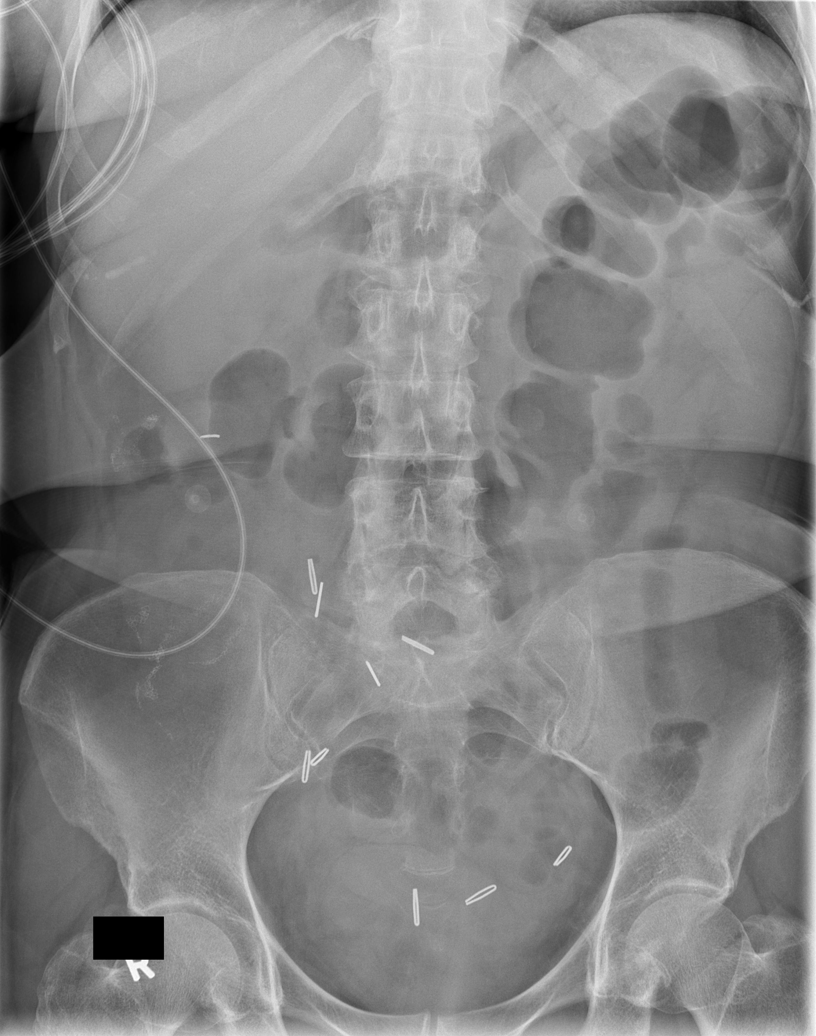

[4 of 4 positions shown; findings below may reference images not displayed]

FINDINGS: Lungs are clear. No pleural effusion or pneumothorax.

Cardiomediastinal silhouette is within normal limits.

Nonspecific bowel gas pattern without disproportionate dilatation
of small bowel to suggest obstruction.  Transverse colon is not
decompressed.

However, there are multiple air fluid levels on the upright
radiograph, raising the possibility of small bowel enteritis.

Surgical sutures overlying the right mid/lower abdomen.

No evidence of free air under the diaphragm on the upright view.

Surgical clips overlying the lower abdomen.
IMPRESSION: No evidence of acute cardiopulmonary disease.

Nonspecific bowel gas pattern but without findings to suggest small
bowel obstruction.

Multiple air fluid levels, possibly reflecting small bowel
enteritis.

No free air.

## 2013-09-03 ENCOUNTER — Other Ambulatory Visit: Payer: Self-pay | Admitting: *Deleted

## 2013-09-03 DIAGNOSIS — C800 Disseminated malignant neoplasm, unspecified: Secondary | ICD-10-CM

## 2013-09-03 DIAGNOSIS — C189 Malignant neoplasm of colon, unspecified: Secondary | ICD-10-CM

## 2013-09-03 MED ORDER — HYDROCODONE-ACETAMINOPHEN 5-325 MG PO TABS: 1.0000 | ORAL_TABLET | Freq: Four times a day (QID) | ORAL | Status: DC | PRN

## 2013-09-03 MED ORDER — OXYCODONE HCL 5 MG PO TABS: 5.0000 mg | ORAL_TABLET | Freq: Four times a day (QID) | ORAL | Status: DC | PRN

## 2013-09-17 ENCOUNTER — Other Ambulatory Visit (HOSPITAL_BASED_OUTPATIENT_CLINIC_OR_DEPARTMENT_OTHER): Payer: Medicare Other

## 2013-09-17 ENCOUNTER — Other Ambulatory Visit: Payer: Self-pay

## 2013-09-17 ENCOUNTER — Ambulatory Visit (HOSPITAL_BASED_OUTPATIENT_CLINIC_OR_DEPARTMENT_OTHER): Payer: Medicare Other

## 2013-09-17 ENCOUNTER — Ambulatory Visit (HOSPITAL_BASED_OUTPATIENT_CLINIC_OR_DEPARTMENT_OTHER): Payer: Medicare Other | Admitting: Oncology

## 2013-09-17 ENCOUNTER — Telehealth: Payer: Self-pay | Admitting: Oncology

## 2013-09-17 VITALS — Wt 122.5 lb

## 2013-09-17 VITALS — BP 108/60 | HR 81 | Temp 98.3°F

## 2013-09-17 DIAGNOSIS — C189 Malignant neoplasm of colon, unspecified: Secondary | ICD-10-CM

## 2013-09-17 DIAGNOSIS — D649 Anemia, unspecified: Secondary | ICD-10-CM

## 2013-09-17 DIAGNOSIS — C800 Disseminated malignant neoplasm, unspecified: Secondary | ICD-10-CM

## 2013-09-17 DIAGNOSIS — C180 Malignant neoplasm of cecum: Secondary | ICD-10-CM

## 2013-09-17 DIAGNOSIS — Z452 Encounter for adjustment and management of vascular access device: Secondary | ICD-10-CM

## 2013-09-17 DIAGNOSIS — Z95828 Presence of other vascular implants and grafts: Secondary | ICD-10-CM

## 2013-09-17 DIAGNOSIS — C772 Secondary and unspecified malignant neoplasm of intra-abdominal lymph nodes: Secondary | ICD-10-CM

## 2013-09-17 LAB — COMPREHENSIVE METABOLIC PANEL (CC13)
ALT: 25 U/L (ref 0–55)
AST: 24 U/L (ref 5–34)
Albumin: 3.5 g/dL (ref 3.5–5.0)
Alkaline Phosphatase: 95 U/L (ref 40–150)
Anion Gap: 12 mEq/L — ABNORMAL HIGH (ref 3–11)
BUN: 32.1 mg/dL — ABNORMAL HIGH (ref 7.0–26.0)
CHLORIDE: 108 meq/L (ref 98–109)
CO2: 18 mEq/L — ABNORMAL LOW (ref 22–29)
CREATININE: 1.2 mg/dL — AB (ref 0.6–1.1)
Calcium: 8.7 mg/dL (ref 8.4–10.4)
Glucose: 89 mg/dl (ref 70–140)
Potassium: 4.4 mEq/L (ref 3.5–5.1)
Sodium: 138 mEq/L (ref 136–145)
Total Bilirubin: 0.4 mg/dL (ref 0.20–1.20)
Total Protein: 7.1 g/dL (ref 6.4–8.3)

## 2013-09-17 LAB — CBC WITH DIFFERENTIAL/PLATELET
BASO%: 0.8 % (ref 0.0–2.0)
BASOS ABS: 0.1 10*3/uL (ref 0.0–0.1)
EOS%: 17 % — ABNORMAL HIGH (ref 0.0–7.0)
Eosinophils Absolute: 1.1 10*3/uL — ABNORMAL HIGH (ref 0.0–0.5)
HCT: 29.2 % — ABNORMAL LOW (ref 34.8–46.6)
HGB: 9.8 g/dL — ABNORMAL LOW (ref 11.6–15.9)
LYMPH#: 1.3 10*3/uL (ref 0.9–3.3)
LYMPH%: 19.4 % (ref 14.0–49.7)
MCH: 31.8 pg (ref 25.1–34.0)
MCHC: 33.6 g/dL (ref 31.5–36.0)
MCV: 94.6 fL (ref 79.5–101.0)
MONO#: 0.5 10*3/uL (ref 0.1–0.9)
MONO%: 7.1 % (ref 0.0–14.0)
NEUT#: 3.7 10*3/uL (ref 1.5–6.5)
NEUT%: 55.7 % (ref 38.4–76.8)
Platelets: 230 10*3/uL (ref 145–400)
RBC: 3.08 10*6/uL — ABNORMAL LOW (ref 3.70–5.45)
RDW: 12.5 % (ref 11.2–14.5)
WBC: 6.6 10*3/uL (ref 3.9–10.3)

## 2013-09-17 LAB — MAGNESIUM (CC13): Magnesium: 1.2 mg/dl — CL (ref 1.5–2.5)

## 2013-09-17 MED ORDER — ALPRAZOLAM 0.5 MG PO TABS: 0.5000 mg | ORAL_TABLET | Freq: Every evening | ORAL | Status: DC | PRN

## 2013-09-17 MED ORDER — SODIUM CHLORIDE 0.9 % IJ SOLN
10.0000 mL | INTRAMUSCULAR | Status: DC | PRN
  Administered 2013-09-17: 10 mL via INTRAVENOUS
  Filled 2013-09-17: qty 10

## 2013-09-17 MED ORDER — POTASSIUM CHLORIDE CRYS ER 20 MEQ PO TBCR: 20.0000 meq | EXTENDED_RELEASE_TABLET | Freq: Two times a day (BID) | ORAL | Status: DC

## 2013-09-17 MED ORDER — HEPARIN SOD (PORK) LOCK FLUSH 100 UNIT/ML IV SOLN
500.0000 [IU] | Freq: Once | INTRAVENOUS | Status: AC
  Administered 2013-09-17: 500 [IU] via INTRAVENOUS
  Filled 2013-09-17: qty 5

## 2013-09-17 MED ORDER — HYDROCODONE-ACETAMINOPHEN 5-325 MG PO TABS: 1.0000 | ORAL_TABLET | Freq: Four times a day (QID) | ORAL | Status: DC | PRN

## 2013-09-17 MED ORDER — OXYCODONE HCL 5 MG PO TABS: 5.0000 mg | ORAL_TABLET | Freq: Four times a day (QID) | ORAL | Status: DC | PRN

## 2013-09-17 NOTE — Telephone Encounter (Signed)
, °

## 2013-09-17 NOTE — Patient Instructions (Signed)

## 2013-09-17 NOTE — Progress Notes (Signed)
  [place] OFFICE PROGRESS NOTE   Diagnosis: Colon cancer  INTERVAL HISTORY:   She returns as scheduled she feels well. She is working and otherwise active. Good appetite. No nausea or vomiting. She takes hydrocodone and oxycodone for intermittent abdominal pain.  Objective:  Vital signs in last 24 hours:  Weight 122 lb 8 oz (55.566 kg), SpO2 100.00%.    HEENT: Neck without mass Resp: Lungs clear bilaterally Cardio: Regular rhythm GI: No hepatomegaly, left abdomen colostomy with semi-formed stool., Nontender, no mass Vascular: No leg edema  Portacath/PICC-without erythema  Lab Results:  Lab Results  Component Value Date   WBC 6.6 09/17/2013   HGB 9.8* 09/17/2013   HCT 29.2* 09/17/2013   MCV 94.6 09/17/2013   PLT 230 09/17/2013   NEUTROABS 3.7 09/17/2013   Potassium 4.4, magnesium 1.2    Imaging:  No results found.  Medications: I have reviewed the patient's current medications.  Assessment/Plan: 1. Extended right colectomy on 09/17/2011 with pathology confirming a T4, N2, M1 tumor. 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 Status post FOLFOX (3 cycles) with progressive nausea and weight loss. Chemotherapy switched to FOLFIRI (2 cycles). Admission with a bowel obstruction December 2013 status post jejuno-colic anastomosis with resolution of obstructive symptoms, progressive carcinomatosis noted at the time of surgery. Status post FOLFIRI/Avastin cycle 1 on 06/14/2012. Cycle 4 on 07/26/2012. Status post cytoreductive surgery and HIPEC with creation of an end descending colostomy 09/25/2012.R1 resection with intraperitoneal mitomycin-C. Pathology as follows: Pelvic stripping poorly differentiated adenocarcinoma signet ring features; round ligament of the liver no malignancy identified; left gutter stripping poorly differentiated adenocarcinoma signet ring features; uterus, bilateral fallopian tubes and ovaries, small bowel, right bowel en block  resection with poorly differentiated adenocarcinoma with signet ring features invading through muscularis propria into surrounding adipose tissue and present multifocally at serosal surface. Intestinal resection margins (x4) negative for carcinoma. 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 27 of 30 lymph nodes involved by carcinoma, extracapsular extension present. Multiple deposits of carcinoma within surrounding adipose tissue. Anastomosis site with mucosal erosion. Endometrial polyp and serosal adhesions not involved by malignancy. Unremarkable fallopian tubes. 2. Anemia-likely secondary to "chronic disease "and possibly microscopic GI blood loss 3. History of Weight loss secondary to progressive metastatic colon cancer with a bowel obstruction and cytoreductive surgery, improved 4. Left arm passport. 5. Crampy abdominal pain during the irinotecan infusion cycle 2 relieved with atropine. 6. Diarrhea following cycle 2 FOLFIRI/Avastin. The diarrhea was controlled with Imodium. 7. Admission 07/19/2012 with intractable nausea/vomiting 8. Nausea/vomiting/diarrhea beginning on day 4 following the FOLFIRI/Avastin given on 07/26/2012-likely irinotecan-induced nausea and enteritis 9. Admission with febrile neutropenia and an abdominal incision abscess/fistula 08/06/2012, 10. Hospitalization at [facility] 10/16/2012 for abdominal pain. Found to have a right lower quadrant fluid collection status post percutaneous drainage. She was discharged home with a drain. The drain has been removed. 11. Hospitalization February 2015 with nausea/vomiting and dehydration  12. Hypomagnesemia-likely secondary to enteric loss    Disposition:  She appears stable. The plan is to continue the current potassium/magnesium supplements. She will continue hydrocodone and oxycodone as needed for pain. Kristen Alexander will return for an office visit and Port-A-Cath flush 11/01/2013.  Ladell Pier, MD  09/17/2013  10:09 AM

## 2013-09-20 ENCOUNTER — Telehealth: Payer: Self-pay | Admitting: *Deleted

## 2013-09-20 NOTE — Telephone Encounter (Addendum)
Message from pt's friend Luann reporting pt passed what looked like stool through rectum and she is concerned. Called pt, she reports she has an occasional urge to defecate but noticed discharge yesterday. Denied any bleeding or pain. Informed her this is normal to pass occasional mucous from rectum. If she notices large amounts, pain or bleeding to call Dr. Clovis Riley. She voiced understanding. Dr. Benay Spice made aware.

## 2013-11-01 ENCOUNTER — Other Ambulatory Visit (HOSPITAL_BASED_OUTPATIENT_CLINIC_OR_DEPARTMENT_OTHER): Payer: Medicare Other

## 2013-11-01 ENCOUNTER — Ambulatory Visit (HOSPITAL_BASED_OUTPATIENT_CLINIC_OR_DEPARTMENT_OTHER): Payer: Medicare Other

## 2013-11-01 ENCOUNTER — Other Ambulatory Visit: Payer: Self-pay | Admitting: *Deleted

## 2013-11-01 ENCOUNTER — Telehealth: Payer: Self-pay | Admitting: Oncology

## 2013-11-01 ENCOUNTER — Ambulatory Visit (HOSPITAL_BASED_OUTPATIENT_CLINIC_OR_DEPARTMENT_OTHER): Payer: Medicare Other | Admitting: Oncology

## 2013-11-01 VITALS — BP 113/64 | HR 74 | Temp 98.0°F | Resp 18 | Ht 63.0 in | Wt 125.8 lb

## 2013-11-01 DIAGNOSIS — R109 Unspecified abdominal pain: Secondary | ICD-10-CM

## 2013-11-01 DIAGNOSIS — C189 Malignant neoplasm of colon, unspecified: Secondary | ICD-10-CM

## 2013-11-01 DIAGNOSIS — C180 Malignant neoplasm of cecum: Secondary | ICD-10-CM

## 2013-11-01 DIAGNOSIS — C800 Disseminated malignant neoplasm, unspecified: Secondary | ICD-10-CM

## 2013-11-01 DIAGNOSIS — C772 Secondary and unspecified malignant neoplasm of intra-abdominal lymph nodes: Secondary | ICD-10-CM

## 2013-11-01 DIAGNOSIS — Z95828 Presence of other vascular implants and grafts: Secondary | ICD-10-CM

## 2013-11-01 LAB — BASIC METABOLIC PANEL (CC13)
Anion Gap: 9 mEq/L (ref 3–11)
BUN: 24.3 mg/dL (ref 7.0–26.0)
CHLORIDE: 109 meq/L (ref 98–109)
CO2: 23 mEq/L (ref 22–29)
Calcium: 9.5 mg/dL (ref 8.4–10.4)
Creatinine: 1.1 mg/dL (ref 0.6–1.1)
Glucose: 97 mg/dl (ref 70–140)
POTASSIUM: 3.7 meq/L (ref 3.5–5.1)
SODIUM: 141 meq/L (ref 136–145)

## 2013-11-01 LAB — CBC WITH DIFFERENTIAL/PLATELET
BASO%: 0.8 % (ref 0.0–2.0)
BASOS ABS: 0.1 10*3/uL (ref 0.0–0.1)
EOS ABS: 1.5 10*3/uL — AB (ref 0.0–0.5)
EOS%: 20.8 % — ABNORMAL HIGH (ref 0.0–7.0)
HCT: 32.1 % — ABNORMAL LOW (ref 34.8–46.6)
HEMOGLOBIN: 10.5 g/dL — AB (ref 11.6–15.9)
LYMPH%: 21.2 % (ref 14.0–49.7)
MCH: 31.2 pg (ref 25.1–34.0)
MCHC: 32.7 g/dL (ref 31.5–36.0)
MCV: 95.4 fL (ref 79.5–101.0)
MONO#: 0.5 10*3/uL (ref 0.1–0.9)
MONO%: 7.2 % (ref 0.0–14.0)
NEUT%: 50 % (ref 38.4–76.8)
NEUTROS ABS: 3.6 10*3/uL (ref 1.5–6.5)
PLATELETS: 298 10*3/uL (ref 145–400)
RBC: 3.37 10*6/uL — ABNORMAL LOW (ref 3.70–5.45)
RDW: 13.2 % (ref 11.2–14.5)
WBC: 7.1 10*3/uL (ref 3.9–10.3)
lymph#: 1.5 10*3/uL (ref 0.9–3.3)

## 2013-11-01 LAB — MAGNESIUM (CC13): MAGNESIUM: 1.4 mg/dL — AB (ref 1.5–2.5)

## 2013-11-01 MED ORDER — SODIUM CHLORIDE 0.9 % IJ SOLN
10.0000 mL | INTRAMUSCULAR | Status: DC | PRN
  Administered 2013-11-01: 10 mL via INTRAVENOUS
  Filled 2013-11-01: qty 10

## 2013-11-01 MED ORDER — ALPRAZOLAM 0.5 MG PO TABS: 0.5000 mg | ORAL_TABLET | Freq: Every evening | ORAL | Status: DC | PRN

## 2013-11-01 MED ORDER — ONDANSETRON 4 MG PO TBDP: 4.0000 mg | ORAL_TABLET | Freq: Three times a day (TID) | ORAL | Status: AC | PRN

## 2013-11-01 MED ORDER — HYDROCODONE-ACETAMINOPHEN 5-325 MG PO TABS: 1.0000 | ORAL_TABLET | Freq: Four times a day (QID) | ORAL | Status: DC | PRN

## 2013-11-01 MED ORDER — OXYCODONE HCL 5 MG PO TABS: 5.0000 mg | ORAL_TABLET | Freq: Four times a day (QID) | ORAL | Status: DC | PRN

## 2013-11-01 MED ORDER — HEPARIN SOD (PORK) LOCK FLUSH 100 UNIT/ML IV SOLN
500.0000 [IU] | Freq: Once | INTRAVENOUS | Status: AC
  Administered 2013-11-01: 500 [IU] via INTRAVENOUS
  Filled 2013-11-01: qty 5

## 2013-11-01 NOTE — Progress Notes (Signed)
[place] OFFICE PROGRESS NOTE   Diagnosis: Colon cancer  INTERVAL HISTORY:   She returns as scheduled. He feels well. Good appetite. She is working. No diarrhea. No nausea or vomiting. She takes hydrocodone and oxycodone for abdominal pain. She had a fall 3 weeks ago and injured the left side of the abdomen. She saw Dr. Clovis Riley yesterday. A CT of the abdomen revealed evidence of progressive disease compared to 05/02/2013. Mesenteric and retroperitoneal lymph nodes of increased in size. Increased soft tissue thickening along the deep aspect of the lower left rectus bundle. Small capsular lesion at the anterior margin of hepatic segment 4. Pre-and but micronodular opacities in the lower lung sounds suggestive of infection. No ascites.   Objective:  Vital signs in last 24 hours:  Blood pressure 113/64, pulse 74, temperature 98 F (36.7 C), temperature source Oral, resp. rate 18, height 5\' 3"  (1.6 m), weight 125 lb 12.8 oz (57.063 kg), SpO2 100.00%.    HEENT: Neck without mass Resp: Lungs clear bilaterally Cardio: Regular rate and rhythm GI: No hepatomegaly, no apparent ascites, left lower quadrant colostomy with formed stool, nontender Vascular: No leg edema   Portacath/PICC-without erythema  Lab Results:  Lab Results  Component Value Date   WBC 7.1 11/01/2013   HGB 10.5* 11/01/2013   HCT 32.1* 11/01/2013   MCV 95.4 11/01/2013   PLT 298 11/01/2013   NEUTROABS 3.6 11/01/2013    potassium 3.7, creatinine 1.1, magnesium 1.4 Medications: I have reviewed the patient's current medications.  Assessment/Plan: 1. Extended right colectomy on 09/17/2011 with pathology confirming a T4, N2, M1 tumor. 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 Status post FOLFOX (3 cycles) with progressive nausea and weight loss. Chemotherapy switched to FOLFIRI (2 cycles). Admission with a bowel obstruction December 2013 status post jejuno-colic anastomosis with resolution  of obstructive symptoms, progressive carcinomatosis noted at the time of surgery. Status post FOLFIRI/Avastin cycle 1 on 06/14/2012. Cycle 4 on 07/26/2012. Status post cytoreductive surgery and HIPEC with creation of an end descending colostomy 09/25/2012.R1 resection with intraperitoneal mitomycin-C. Pathology as follows: Pelvic stripping poorly differentiated adenocarcinoma signet ring features; round ligament of the liver no malignancy identified; left gutter stripping poorly differentiated adenocarcinoma signet ring features; uterus, bilateral fallopian tubes and ovaries, small bowel, right bowel en block resection with poorly differentiated adenocarcinoma with signet ring features invading through muscularis propria into surrounding adipose tissue and present multifocally at serosal surface. Intestinal resection margins (x4) negative for carcinoma. 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 27 of 30 lymph nodes involved by carcinoma, extracapsular extension present. Multiple deposits of carcinoma within surrounding adipose tissue. Anastomosis site with mucosal erosion. Endometrial polyp and serosal adhesions not involved by malignancy. Unremarkable fallopian tubes.  Restaging CT at [facility] 10/31/2013 with an increase in the size of small abdominal/retroperitoneal lymph nodes, increased stranding in the central small bowel mesentery, capsular liver lesion at segment 4, and soft tissue thickening at the deep aspect of the medial left rectus bundle 2. Anemia-likely secondary to "chronic disease "and possibly microscopic GI blood loss 3. History of Weight loss secondary to progressive metastatic colon cancer with a bowel obstruction and cytoreductive surgery, improved 4. Left arm passport. 5. Crampy abdominal pain during the irinotecan infusion cycle 2 relieved with atropine. 6. Diarrhea following cycle 2 FOLFIRI/Avastin. The diarrhea was controlled with Imodium. 7. Admission 07/19/2012 with intractable  nausea/vomiting 8. Nausea/vomiting/diarrhea beginning on day 4 following the FOLFIRI/Avastin given on 07/26/2012-likely irinotecan-induced nausea and enteritis 9. Admission with febrile  neutropenia and an abdominal incision abscess/fistula 08/06/2012, 10. Hospitalization at [facility] 10/16/2012 for abdominal pain. Found to have a right lower quadrant fluid collection status post percutaneous drainage. She was discharged home with a drain. The drain has been removed. 11. Hospitalization February 2015 with nausea/vomiting and dehydration  12. Hypomagnesemia-likely secondary to enteric loss, improved  Disposition:  Her overall performance status is unchanged. The restaging CT 10/31/2013 reveals minimal evidence of disease progression. The plan is to continue observation. She will return for a lab visit and Port-A-Cath flush in 6 weeks. Ms. Locurto is scheduled for an office visit in 3 months.  Betsy Coder, MD  11/01/2013  5:58 PM

## 2013-11-01 NOTE — Telephone Encounter (Signed)
gv adn printed appt sched and avs for pt for Aug and OCT

## 2013-12-13 ENCOUNTER — Other Ambulatory Visit (HOSPITAL_BASED_OUTPATIENT_CLINIC_OR_DEPARTMENT_OTHER): Payer: Medicare Other

## 2013-12-13 ENCOUNTER — Other Ambulatory Visit: Payer: Self-pay | Admitting: *Deleted

## 2013-12-13 ENCOUNTER — Ambulatory Visit: Payer: Medicare Other

## 2013-12-13 VITALS — BP 111/55 | HR 63 | Temp 97.7°F

## 2013-12-13 DIAGNOSIS — Z95828 Presence of other vascular implants and grafts: Secondary | ICD-10-CM

## 2013-12-13 DIAGNOSIS — C786 Secondary malignant neoplasm of retroperitoneum and peritoneum: Secondary | ICD-10-CM

## 2013-12-13 DIAGNOSIS — C189 Malignant neoplasm of colon, unspecified: Secondary | ICD-10-CM

## 2013-12-13 DIAGNOSIS — C180 Malignant neoplasm of cecum: Secondary | ICD-10-CM

## 2013-12-13 DIAGNOSIS — C800 Disseminated malignant neoplasm, unspecified: Secondary | ICD-10-CM

## 2013-12-13 DIAGNOSIS — C172 Malignant neoplasm of ileum: Secondary | ICD-10-CM

## 2013-12-13 DIAGNOSIS — D649 Anemia, unspecified: Secondary | ICD-10-CM

## 2013-12-13 LAB — CBC WITH DIFFERENTIAL/PLATELET
BASO%: 0.5 % (ref 0.0–2.0)
BASOS ABS: 0 10*3/uL (ref 0.0–0.1)
EOS%: 13.7 % — AB (ref 0.0–7.0)
Eosinophils Absolute: 0.8 10*3/uL — ABNORMAL HIGH (ref 0.0–0.5)
HCT: 34 % — ABNORMAL LOW (ref 34.8–46.6)
HEMOGLOBIN: 11 g/dL — AB (ref 11.6–15.9)
LYMPH#: 1.3 10*3/uL (ref 0.9–3.3)
LYMPH%: 23.5 % (ref 14.0–49.7)
MCH: 30.8 pg (ref 25.1–34.0)
MCHC: 32.3 g/dL (ref 31.5–36.0)
MCV: 95.4 fL (ref 79.5–101.0)
MONO#: 0.5 10*3/uL (ref 0.1–0.9)
MONO%: 8 % (ref 0.0–14.0)
NEUT#: 3.1 10*3/uL (ref 1.5–6.5)
NEUT%: 54.3 % (ref 38.4–76.8)
Platelets: 231 10*3/uL (ref 145–400)
RBC: 3.56 10*6/uL — ABNORMAL LOW (ref 3.70–5.45)
RDW: 13.1 % (ref 11.2–14.5)
WBC: 5.7 10*3/uL (ref 3.9–10.3)

## 2013-12-13 LAB — COMPREHENSIVE METABOLIC PANEL (CC13)
ALT: 64 U/L — ABNORMAL HIGH (ref 0–55)
ANION GAP: 9 meq/L (ref 3–11)
AST: 39 U/L — AB (ref 5–34)
Albumin: 3.4 g/dL — ABNORMAL LOW (ref 3.5–5.0)
Alkaline Phosphatase: 136 U/L (ref 40–150)
BUN: 22.4 mg/dL (ref 7.0–26.0)
CALCIUM: 9.4 mg/dL (ref 8.4–10.4)
CHLORIDE: 112 meq/L — AB (ref 98–109)
CO2: 20 meq/L — AB (ref 22–29)
CREATININE: 0.9 mg/dL (ref 0.6–1.1)
GLUCOSE: 81 mg/dL (ref 70–140)
Potassium: 3.7 mEq/L (ref 3.5–5.1)
Sodium: 141 mEq/L (ref 136–145)
Total Bilirubin: 0.37 mg/dL (ref 0.20–1.20)
Total Protein: 6.8 g/dL (ref 6.4–8.3)

## 2013-12-13 LAB — MAGNESIUM (CC13): MAGNESIUM: 1.6 mg/dL (ref 1.5–2.5)

## 2013-12-13 MED ORDER — HEPARIN SOD (PORK) LOCK FLUSH 100 UNIT/ML IV SOLN
500.0000 [IU] | Freq: Once | INTRAVENOUS | Status: AC
Start: 2013-12-13 — End: 2013-12-13
  Administered 2013-12-13: 500 [IU] via INTRAVENOUS
  Filled 2013-12-13: qty 5

## 2013-12-13 MED ORDER — SODIUM CHLORIDE 0.9 % IJ SOLN
10.0000 mL | INTRAMUSCULAR | Status: DC | PRN
  Administered 2013-12-13: 10 mL via INTRAVENOUS
  Filled 2013-12-13: qty 10

## 2013-12-13 MED ORDER — POTASSIUM CHLORIDE CRYS ER 20 MEQ PO TBCR: 20.0000 meq | EXTENDED_RELEASE_TABLET | Freq: Two times a day (BID) | ORAL | Status: DC

## 2013-12-13 MED ORDER — OXYCODONE HCL 5 MG PO TABS: 5.0000 mg | ORAL_TABLET | Freq: Four times a day (QID) | ORAL | Status: DC | PRN

## 2013-12-13 MED ORDER — HYDROCODONE-ACETAMINOPHEN 5-325 MG PO TABS
1.0000 | ORAL_TABLET | Freq: Four times a day (QID) | ORAL | Status: DC | PRN
Start: 2013-12-13 — End: 2014-01-07

## 2013-12-13 NOTE — Patient Instructions (Signed)

## 2013-12-13 NOTE — Telephone Encounter (Signed)
Needs refills on her pain meds and potassium. Will pick up tomorrow-call patient at [phone]. Made patient aware scripts will be ready for pick up tomorrow after 0900.

## 2014-01-07 ENCOUNTER — Other Ambulatory Visit: Payer: Self-pay | Admitting: *Deleted

## 2014-01-07 ENCOUNTER — Telehealth: Payer: Self-pay | Admitting: *Deleted

## 2014-01-07 DIAGNOSIS — C189 Malignant neoplasm of colon, unspecified: Secondary | ICD-10-CM

## 2014-01-07 DIAGNOSIS — C800 Disseminated malignant neoplasm, unspecified: Secondary | ICD-10-CM

## 2014-01-07 MED ORDER — POTASSIUM CHLORIDE CRYS ER 20 MEQ PO TBCR: 20.0000 meq | EXTENDED_RELEASE_TABLET | Freq: Two times a day (BID) | ORAL | Status: AC

## 2014-01-07 MED ORDER — HYDROCODONE-ACETAMINOPHEN 5-325 MG PO TABS: 1.0000 | ORAL_TABLET | Freq: Four times a day (QID) | ORAL | Status: AC | PRN

## 2014-01-07 MED ORDER — OXYCODONE HCL 5 MG PO TABS: 5.0000 mg | ORAL_TABLET | Freq: Four times a day (QID) | ORAL | Status: AC | PRN

## 2014-01-07 NOTE — Telephone Encounter (Signed)
Attempted to call pt x2 with no answer; left message to call office re: stomach cramps.  Per MD may need to go to ED in [facility] for evaluation.

## 2014-01-07 NOTE — Telephone Encounter (Signed)
Pt called reports "I think I have a stomach virus; nausea comes and goes; I was throwing up but that stopped 2 days ago; now after I eat I get cramps"  Pt denies fever, no other c/o; "do I need to come in earlier?"  Next Office visit 01/24/14.  Note to Dr. Benay Spice.

## 2014-01-08 NOTE — Telephone Encounter (Signed)
Per Dr. Benay Spice; spoke with pt re: stomach cramps.  She states that she feel she may be dehydrated also.  Instructed her per MD that she may need to go to ED in [facility] for evaluation; MD concerned it may possibly be small bowel obstruction; possibly need xray.  Pt verbalized understanding and stated "I'll do that"

## 2014-01-15 ENCOUNTER — Other Ambulatory Visit: Payer: Self-pay | Admitting: Oncology

## 2014-01-15 DIAGNOSIS — C189 Malignant neoplasm of colon, unspecified: Secondary | ICD-10-CM

## 2014-01-16 NOTE — Telephone Encounter (Signed)
Called alprazolam in to pharmacy, spoke with "Merry Proud".

## 2014-01-23 ENCOUNTER — Telehealth: Payer: Self-pay | Admitting: *Deleted

## 2014-01-23 NOTE — Telephone Encounter (Signed)
Received call that [place] intestinal blockage, and will need to cancel tomorrow's appt; will call us back the day patient is discharged for follow-up. Dr. Benay Spice made aware.

## 2014-01-24 ENCOUNTER — Ambulatory Visit: Payer: Medicare Other | Admitting: Oncology

## 2014-01-24 ENCOUNTER — Other Ambulatory Visit: Payer: Medicare Other

## 2014-01-31 ENCOUNTER — Telehealth: Payer: Self-pay | Admitting: *Deleted

## 2014-01-31 NOTE — Telephone Encounter (Signed)
Being discharged from [facility] tomorrow is probable with Hospice referral made. She had surgery at [facility] with a venting gastrostomy tube placed. Asking if Dr. Benay Spice will be attending? Inquired if she is going to be living here with her friends or going back to [facility]? RN will find out the exact D/C plans and call back tomorrow. Informed her if she is in [place], he will be her attending Hospice MD and would like Hospice to assist with symptom management. She also added that patient has been made a DNR at hospital.

## 2014-02-01 ENCOUNTER — Telehealth: Payer: Self-pay | Admitting: *Deleted

## 2014-02-01 NOTE — Telephone Encounter (Signed)
Confirmed with Mina Marble that she will be discharged home on 10/19 to her [place] residence with Hospice care. Will have G-tube for decompression and still has her colostomy. Diet is currently full liquids. Hospice will visit her Monday afternoon.

## 2014-02-19 ENCOUNTER — Other Ambulatory Visit: Payer: Self-pay | Admitting: *Deleted

## 2014-02-19 ENCOUNTER — Telehealth: Payer: Self-pay | Admitting: Oncology

## 2014-02-19 MED ORDER — FENTANYL 50 MCG/HR TD PT72: 50.0000 ug | MEDICATED_PATCH | TRANSDERMAL | Status: AC

## 2014-02-19 NOTE — Telephone Encounter (Signed)
Confirm d/t for 11/24 appt.

## 2014-02-19 NOTE — Telephone Encounter (Signed)
Message from [place], [place] RN requesting refill on Fentanyl patches. Currently using 50 mcg Q 72 hours. Reviewed with Dr. Benay Spice, order received. Will be faxed to [facility]. Orders entered for F/U appt 11/24.

## 2014-03-12 ENCOUNTER — Ambulatory Visit: Payer: Medicare Other | Admitting: Oncology

## 2014-03-19 DEATH — deceased

## 2015-10-03 ENCOUNTER — Other Ambulatory Visit: Payer: Self-pay | Admitting: Nurse Practitioner
